# Patient Record
Sex: Male | Born: 1960 | Race: Black or African American | Hispanic: No | Marital: Married | Smoking: Light tobacco smoker
Health system: Southern US, Community
[De-identification: ages and names within clinical notes are randomized; demographics above are authoritative.]

## PROBLEM LIST (undated history)

## (undated) DIAGNOSIS — G4730 Sleep apnea, unspecified: Secondary | ICD-10-CM

## (undated) DIAGNOSIS — I10 Essential (primary) hypertension: Secondary | ICD-10-CM

## (undated) HISTORY — PX: HERNIA REPAIR: SHX51

## (undated) HISTORY — DX: Sleep apnea, unspecified: G47.30

## (undated) HISTORY — DX: Essential (primary) hypertension: I10

---

## 2012-08-29 DIAGNOSIS — R9431 Abnormal electrocardiogram [ECG] [EKG]: Secondary | ICD-10-CM | POA: Insufficient documentation

## 2012-09-17 DIAGNOSIS — K4090 Unilateral inguinal hernia, without obstruction or gangrene, not specified as recurrent: Secondary | ICD-10-CM | POA: Insufficient documentation

## 2014-07-13 DIAGNOSIS — M25562 Pain in left knee: Secondary | ICD-10-CM | POA: Insufficient documentation

## 2014-07-13 DIAGNOSIS — L439 Lichen planus, unspecified: Secondary | ICD-10-CM | POA: Insufficient documentation

## 2015-01-14 DIAGNOSIS — F5101 Primary insomnia: Secondary | ICD-10-CM | POA: Insufficient documentation

## 2015-07-21 ENCOUNTER — Ambulatory Visit: Payer: Self-pay | Admitting: Sports Medicine

## 2015-08-10 ENCOUNTER — Ambulatory Visit: Payer: Self-pay | Admitting: Sports Medicine

## 2015-08-19 ENCOUNTER — Ambulatory Visit (INDEPENDENT_AMBULATORY_CARE_PROVIDER_SITE_OTHER): Payer: BLUE CROSS/BLUE SHIELD

## 2015-08-19 ENCOUNTER — Encounter: Payer: Self-pay | Admitting: Sports Medicine

## 2015-08-19 ENCOUNTER — Telehealth: Payer: Self-pay | Admitting: Sports Medicine

## 2015-08-19 ENCOUNTER — Ambulatory Visit (INDEPENDENT_AMBULATORY_CARE_PROVIDER_SITE_OTHER): Payer: BLUE CROSS/BLUE SHIELD | Admitting: Sports Medicine

## 2015-08-19 VITALS — BP 115/80 | HR 88 | Resp 18 | Ht 70.75 in | Wt 250.1 lb

## 2015-08-19 DIAGNOSIS — I10 Essential (primary) hypertension: Secondary | ICD-10-CM

## 2015-08-19 DIAGNOSIS — E669 Obesity, unspecified: Secondary | ICD-10-CM

## 2015-08-19 DIAGNOSIS — M4806 Spinal stenosis, lumbar region: Secondary | ICD-10-CM

## 2015-08-19 DIAGNOSIS — M170 Bilateral primary osteoarthritis of knee: Secondary | ICD-10-CM

## 2015-08-19 DIAGNOSIS — M47816 Spondylosis without myelopathy or radiculopathy, lumbar region: Secondary | ICD-10-CM

## 2015-08-19 DIAGNOSIS — R7303 Prediabetes: Secondary | ICD-10-CM | POA: Diagnosis not present

## 2015-08-19 DIAGNOSIS — M5136 Other intervertebral disc degeneration, lumbar region: Secondary | ICD-10-CM | POA: Diagnosis not present

## 2015-08-19 MED ORDER — PHENTERMINE HCL 37.5 MG PO TABS: ORAL_TABLET | ORAL | Status: DC

## 2015-08-19 MED ORDER — MELOXICAM 15 MG PO TABS: ORAL_TABLET | ORAL | Status: DC

## 2015-08-19 NOTE — Assessment & Plan Note (Signed)
Adding meloxicam, we will also get him approved for Visco supplementation.

## 2015-08-19 NOTE — Telephone Encounter (Signed)
-----   Message from Monica Bectonhomas J Thekkekandam, MD sent at 08/19/2015  9:25 AM EDT ----- Serena CroissantHey Boogs,  orthovisc bilateral approval please, knee OA xray confirmed, failed NSAIDS and steroid injections. ___________________________________________ Ihor Austinhomas J. Benjamin Stainhekkekandam, M.D., ABFM., CAQSM. Primary Care and Sports Medicine [facility]  Adjunct Instructor of Family Medicine  [employer]

## 2015-08-19 NOTE — Telephone Encounter (Signed)
Submitted for approval on Orthovisc. Awaiting confirmation.  

## 2015-08-19 NOTE — Assessment & Plan Note (Signed)
Pain is predominantly right-sided, axial and likely facet mediated. X-rays, formal PT, weight loss

## 2015-08-19 NOTE — Progress Notes (Signed)
  Subjective:    CC: Establish care.   HPI:  Patient complains of progressively worsening middle low back pain. He states his back is very stiff in morning. Pain is worse with movement but does bother him at rest. Patient has had previous [facility] [place] and was told her had degenerative disc disease. Patient has never done physical therapy or taken medications for the back pain.   Patient is also having bilateral knee pain. Pain began having pain when he was in the [employer] 20 years ago. Patient states knees "throb" at rest, but the pain is much worse with standing and walking. Going down the stairs is especially difficult and painful. He has had to cut back on his physical activity due to the pain. [facility] for this problem and received bilateral knee injections. Patient states the injections only helped for about 2 months before the pain returned. Patient went to one physical [facility] [place] but did not believe it helped so did not return. He is not currently taking NSAIDs for pain.   Past medical history, Surgical history, Family history not pertinant except as noted below, Social history, Allergies, and medications have been entered into the medical record, reviewed, and no changes needed.   Review of Systems: No headache, visual changes, nausea, vomiting, diarrhea, constipation, dizziness, abdominal pain, skin rash, fevers, chills, night sweats, swollen lymph nodes, weight loss, chest pain, body aches, joint swelling, muscle aches, shortness of breath, mood changes, visual or auditory hallucinations.  Objective:    General: Well Developed, well nourished, and in no acute distress.  Neuro: Alert and oriented x3, extra-ocular muscles intact, sensation grossly intact.  HEENT: Normocephalic, atraumatic, pupils equal round reactive to light, neck supple, no masses, no  lymphadenopathy, thyroid nonpalpable.  Skin: Warm and dry, no rashes noted.  Cardiac: Regular rate and rhythm, no murmurs rubs or gallops.  Respiratory: Clear to auscultation bilaterally. Not using accessory muscles, speaking in full sentences.  Abdominal: Soft, nontender, nondistended, positive bowel sounds, no masses, no organomegaly.  Musculoskeletal: Shoulder, elbow, wrist, hip, knee, ankle stable, and with full range of motion.    Impression and Recommendations:   1. Primary Osteoarthritis of bilateral knees- Xrays, Meloxicam, Visco supplementation  2. Lumbar Lordosis- Formal PT, weight loss  3. Hypertension- well controlled, no changes  4. Obesity- Start Phentermine, Nutrition Referral   The patient was counselled, risk factors were discussed, anticipatory guidance given.

## 2015-08-19 NOTE — Assessment & Plan Note (Signed)
Starting phentermine, return monthly for weight checks and refills, nutrition referral.

## 2015-08-19 NOTE — Assessment & Plan Note (Signed)
Well controlled, no changes 

## 2015-08-20 NOTE — Telephone Encounter (Signed)
Received the following information from [employer] Benefits investigation:  This is a Editor, commissioning PPO Plan. The effective date is 05/02/2007. K4461 is covered at 100% and 20611 is covered at 100% of the contracted rate when administered in an office setting, no deductible. No referral is required. Pre-certification is not required. The specialist office copay is $40 if billed. Once out of pocket has been met, the copay will be waived. The reference number is [id number].  Left VM for Pt to return clinic call to schedule appointments for injections.

## 2015-08-25 ENCOUNTER — Ambulatory Visit (INDEPENDENT_AMBULATORY_CARE_PROVIDER_SITE_OTHER): Payer: BLUE CROSS/BLUE SHIELD | Admitting: Physical Therapy

## 2015-08-25 ENCOUNTER — Encounter: Payer: Self-pay | Admitting: Physical Therapy

## 2015-08-25 DIAGNOSIS — M6281 Muscle weakness (generalized): Secondary | ICD-10-CM | POA: Diagnosis not present

## 2015-08-25 DIAGNOSIS — M545 Low back pain, unspecified: Secondary | ICD-10-CM

## 2015-08-25 DIAGNOSIS — M25561 Pain in right knee: Secondary | ICD-10-CM | POA: Diagnosis not present
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
## 2015-08-25 NOTE — Patient Instructions (Addendum)
Pelvic Press     Place hands under belly between navel and pubic bone, palms up. Feel pressure on hands. Increase pressure on hands by pressing pelvis down. This is NOT a pelvic tilt. Hold __5_ seconds. Relax. Repeat _10__ times. Once a day.  KNEE: Flexion - Prone - hands under hips    Hold pelvic press. Bend knee, then return the foot down. Repeat on opposite leg. Do not raise hips. _10__ reps per set. When this is mastered, pull both heels up at same time, x 10 reps.  Once a day.  Don't let your hips rotate   Leg Lift: One-Leg   Press pelvis down. Keep knee straight; lengthen and lift one leg (from waist). Do not twist body. Keep other leg down. Hold _1__ seconds. Relax. Repeat 10 time. Repeat with other leg.  HIP: Extension / KNEE: Flexion - Prone    Hold pelvic press. Bend knee, squeeze glutes. Raise leg up  10___ reps per set, _1__ sets per day, _1__ time a day.   Axial Extension- Upper body sequence * always start with pelvic press    Lie on stomach with forehead resting on floor and arms at sides. Tuck chin in and raise head from floor without bending it up or down. Repeat ___10_ times per set. Do __1__ sets per session. Do _1___ sessions per day.  Progression:  Arms at side Arms in T shape Arms in W shape  Arms in M shape Arms in Y shape  [facility] [address] [place], [place] [address]  [phone] (office) [phone] (fax)

## 2015-08-30 ENCOUNTER — Ambulatory Visit (INDEPENDENT_AMBULATORY_CARE_PROVIDER_SITE_OTHER): Payer: BLUE CROSS/BLUE SHIELD | Admitting: Physical Therapy

## 2015-08-30 DIAGNOSIS — M545 Low back pain, unspecified: Secondary | ICD-10-CM

## 2015-08-30 DIAGNOSIS — M25561 Pain in right knee: Secondary | ICD-10-CM

## 2015-08-30 DIAGNOSIS — M6281 Muscle weakness (generalized): Secondary | ICD-10-CM

## 2015-08-30 NOTE — Therapy (Signed)
[facility] [place] [address] [place], [place], [address] Phone: [phone]   Fax:  [phone]  Physical Therapy Treatment  Patient Details  Name: Jeffrey Burnett MRN: [id number] Date of Birth: 06-07-1960 Referring Provider: Dr. Briant Sites  Encounter Date: 08/30/2015      PT End of Session - 08/30/15 0736    Visit Number 2   Number of Visits 12   PT Start Time 0733   PT Stop Time 0817   PT Time Calculation (min) 44 min      Past Medical History  Diagnosis Date  . Hypertension   . Sleep apnea     Past Surgical History  Procedure Laterality Date  . Hernia repair      hiatal hernia    There were no vitals filed for this visit.      Subjective Assessment - 08/30/15 0736    Subjective Pt reports no new changes since last visit.  He reports he would like to review HEP to know if he is doing them correctly.   Patient Stated Goals reduce the pain, have easier transitions to stand,    Currently in Pain? Yes   Pain Score 6    Pain Location Back   Pain Orientation Left;Right;Lower   Pain Descriptors / Indicators Throbbing;Sore   Pain Type Chronic pain   Aggravating Factors  transitional movments, prolonged postures.   Pain Relieving Factors estim.             [facility] PT Assessment - 08/30/15 0001    Assessment   Medical Diagnosis lumbar spondylosis   Referring Provider Dr. Briant Sites   Onset Date/Surgical Date 08/24/13   Next MD Visit 09/20/15   Prior Therapy a couple yrs ago           [facility] Adult PT Treatment/Exercise - 08/30/15 0001    Lumbar Exercises: Stretches   Passive Hamstring Stretch 2 reps;30 seconds  each leg, with strap   Passive Hamstring Stretch Limitations 2 reps seated with straight back   (very tight/ limited)   Lumbar Exercises: Aerobic   Stationary Bike NuStep L4: 5 min    Lumbar Exercises: Seated   Sit to Stand 5 reps  with core engaged   Lumbar Exercises: Supine   Ab Set 10  reps;5 seconds   Other Supine Lumbar Exercises Pelvic press x 10 reps x 5 sec (difficulty isolating without glute engagement); repeated with 5 reps unilateral knee flexion x 5 reps each side.    Other Supine Lumbar Exercises supine to/from sit via log roll x 5 reps    Moist Heat Therapy   Number Minutes Moist Heat 15 Minutes   Moist Heat Location Lumbar Spine   Electrical Stimulation   Electrical Stimulation Location lumbar paraspinals (bilaterally)   Electrical Stimulation Action IFC   Electrical Stimulation Parameters  to tolerance    Electrical Stimulation Goals Tone;Pain           PT Education - 08/30/15 1005    Education provided Yes   Education Details HEP, information on TENS unit.    Person(s) Educated Patient   Methods Explanation;Handout   Comprehension Verbalized understanding             PT Long Term Goals - 08/30/15 1005    PT LONG TERM GOAL #1   Title Independent with advanced HEP ( 10/06/15)    Time 6   Period Weeks   Status On-going   PT LONG TERM GOAL #2   Title  demo bilat hip extension and abduction strength =/> 5-/5 ( 10/06/15)    Time 6   Period Weeks   Status On-going   PT LONG TERM GOAL #3   Title report over reduction of pain in his back and bilat knees =/> 50% with daily activity ( 10/06/15)    Time 6   Period Weeks   Status On-going   PT LONG TERM GOAL #4   Title transition sit to stand with 50% less pain ( 10/06/15)    Time 6   Period Weeks   Status On-going   PT LONG TERM GOAL #5   Title consistently perform squats with good form ( 10/06/15)    Time 6   Period Weeks   Status On-going   PT LONG TERM GOAL #6   Title improve FOTO =/< 39% limited ( 10/06/15)    Time 6   Period Weeks   Status On-going               Plan - 08/30/15 1002    Clinical Impression Statement Pt tolerated today's therapeutic exercises with minimal increase in pain.  Pt demonstrated difficulty engaging multifidi without strongly contracting glutes and  hamstrings with pelvic press; slightly improved with tactile/ VC.   Progressing towards goals.    Rehab Potential Good   PT Frequency 2x / week   PT Duration 6 weeks   PT Treatment/Interventions Ultrasound;Traction;Patient/family education;Passive range of motion;Dry needling;Cryotherapy;Electrical Stimulation;Moist Heat;Therapeutic exercise;Manual techniques;Vasopneumatic Device;Taping   PT Next Visit Plan progress deep core ther ex, LE quad eccentric work, TDN and manual therapy to back as indicated.    Consulted and Agree with Plan of Care Patient      Patient will benefit from skilled therapeutic intervention in order to improve the following deficits and impairments:  Decreased strength, Hypomobility, Impaired flexibility, Pain, Increased muscle spasms, Decreased range of motion  Visit Diagnosis: Bilateral low back pain without sciatica  Muscle weakness (generalized)  Pain in right knee     Problem List Patient Active Problem List   Diagnosis Date Noted  . Primary osteoarthritis of both knees 08/19/2015  . 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 Essential hypertension, benign 08/19/2015  . Prediabetes 08/19/2015  . Obesity 08/19/2015   Mayer CamelJennifer Carlson-Long, PTA 08/30/2015 10:10 AM   [facility] [address] [place], [place], [address] Phone: [phone]   Fax:  [phone]  Name: Jeffrey Burnett MRN: [id number] Date of Birth: February 24, 1961

## 2015-08-30 NOTE — Patient Instructions (Signed)
Hamstring Step 1    Straighten left knee. Keep knee level with other knee or on bolster. Hold _30__ seconds. Relax knee by returning foot to start. Repeat __2-3_ times.   Abdominal Bracing With Pelvic Floor (Hook-Lying)   With neutral spine, tighten pelvic floor and abdominals. Hold 5-10 seconds. Repeat __10_ times. Do _1__ times a day.    [facility] [address] [place], [place] [address]  [phone] (office) [phone] (fax)

## 2015-09-01 ENCOUNTER — Encounter: Payer: BLUE CROSS/BLUE SHIELD | Admitting: Physical Therapy

## 2015-09-03 ENCOUNTER — Ambulatory Visit (INDEPENDENT_AMBULATORY_CARE_PROVIDER_SITE_OTHER): Payer: BLUE CROSS/BLUE SHIELD | Admitting: Rehabilitative and Restorative Service Providers"

## 2015-09-03 ENCOUNTER — Encounter: Payer: Self-pay | Admitting: Rehabilitative and Restorative Service Providers"

## 2015-09-03 DIAGNOSIS — M545 Low back pain, unspecified: Secondary | ICD-10-CM

## 2015-09-03 DIAGNOSIS — M6281 Muscle weakness (generalized): Secondary | ICD-10-CM

## 2015-09-03 DIAGNOSIS — M25561 Pain in right knee: Secondary | ICD-10-CM | POA: Diagnosis not present

## 2015-09-03 NOTE — Patient Instructions (Signed)
Abdominal Bracing With Pelvic Floor (Hook-Lying)    With neutral spine, tighten pelvic floor and abdominals sucking belly button to back bone; tighten muscles in low back at waist. Hold 10 sec  Repeat __10_ times. Do __several_ times a day. Progress to do this in sitting; standing; walking and with functional activities   Trigger Point Dry Needling  . What is Trigger Point Dry Needling (DN)? o DN is a physical therapy technique used to treat muscle pain and dysfunction. Specifically, DN helps deactivate muscle trigger points (muscle knots).  o A thin filiform needle is used to penetrate the skin and stimulate the underlying trigger point. The goal is for a local twitch response (LTR) to occur and for the trigger point to relax. No medication of any kind is injected during the procedure.   . What Does Trigger Point Dry Needling Feel Like?  o The procedure feels different for each individual patient. Some patients report that they do not actually feel the needle enter the skin and overall the process is not painful. Very mild bleeding may occur. However, many patients feel a deep cramping in the muscle in which the needle was inserted. This is the local twitch response.   Marland Kitchen. How Will I feel after the treatment? o Soreness is normal, and the onset of soreness may not occur for a few hours. Typically this soreness does not last longer than two days.  o Bruising is uncommon, however; ice can be used to decrease any possible bruising.  o In rare cases feeling tired or nauseous after the treatment is normal. In addition, your symptoms may get worse before they get better, this period will typically not last longer than 24 hours.   . What Can I do After My Treatment? o Increase your hydration by drinking more water for the next 24 hours. o You may place ice or heat on the areas treated that have become sore, however, do not use heat on inflamed or bruised areas. Heat often brings more relief post  needling. o You can continue your regular activities, but vigorous activity is not recommended initially after the treatment for 24 hours. o DN is best combined with other physical therapy such as strengthening, stretching, and other therapies.  o

## 2015-09-03 NOTE — Therapy (Signed)
[facility] [address] [place], [place], [address] Phone: [phone]   Fax:  [phone]  Physical Therapy Treatment  Patient Details  Name: Jeffrey PrimeOliver Mikkelson MRN: [id number] Date of Birth: 1960-08-25 Referring Provider: Dr. Briant Siteshekkekandem  Encounter Date: 09/03/2015      PT End of Session - 09/03/15 0807    Visit Number 3   Number of Visits 12   Date for PT Re-Evaluation 10/06/15   PT Start Time 0805   PT Stop Time 0900   PT Time Calculation (min) 55 min   Activity Tolerance Patient tolerated treatment well      Past Medical History  Diagnosis Date  . Hypertension   . Sleep apnea     Past Surgical History  Procedure Laterality Date  . Hernia repair      hiatal hernia    There were no vitals filed for this visit.      Subjective Assessment - 09/03/15 0807    Subjective Patient reports that he has not noticed much difference - but does feel that the exercises are beginning to work some. He stopped taking the antiinflammatory medicine from the doctor Monday and can tell a difference without that.    Currently in Pain? Yes   Pain Score 7    Pain Location Back   Pain Orientation Left;Right;Lower   Pain Descriptors / Indicators Throbbing;Sore                         [facility] Adult PT Treatment/Exercise - 09/03/15 0001    Exercises   Exercises Lumbar   Lumbar Exercises: Stretches   Passive Hamstring Stretch 2 reps;30 seconds  each leg, w/ strap; modified to opposite knee bent d/t LBP    Lumbar Exercises: Aerobic   Stationary Bike NuStep L5: 5 min    Lumbar Exercises: Supine   Ab Set 10 reps;5 seconds   Other Supine Lumbar Exercises Pelvic press x 10 reps x 5 sec (difficulty isolating without glute engagement); repeated with 5 reps unilateral knee flexion x 5 reps each side.    Other Supine Lumbar Exercises supine to/from sit via log roll x 2 - reviewed technique    Moist Heat Therapy   Number  Minutes Moist Heat 15 Minutes   Moist Heat Location Lumbar Spine   Electrical Stimulation   Electrical Stimulation Location lumbar paraspinals (bilaterally)   Electrical Stimulation Action IFC   Electrical Stimulation Parameters to tolerance    Electrical Stimulation Goals Tone;Pain   Manual Therapy   Soft tissue mobilization bilat multifi and lumbar paraspinals           Trigger Point Dry Needling - 09/03/15 0848    Longissimus Response Twitch response elicited;Palpable increased muscle length              PT Education - 09/03/15 0846    Education provided Yes   Education Details HEP; TDN   Person(s) Educated Patient   Methods Explanation;Demonstration;Tactile cues;Verbal cues;Handout   Comprehension Verbalized understanding;Returned demonstration;Verbal cues required;Tactile cues required             PT Long Term Goals - 08/30/15 1005    PT LONG TERM GOAL #1   Title Independent with advanced HEP ( 10/06/15)    Time 6   Period Weeks   Status On-going   PT LONG TERM GOAL #2   Title demo bilat hip extension and abduction strength =/> 5-/5 ( 10/06/15)    Time  6   Period Weeks   Status On-going   PT LONG TERM GOAL #3   Title report over reduction of pain in his back and bilat knees =/> 50% with daily activity ( 10/06/15)    Time 6   Period Weeks   Status On-going   PT LONG TERM GOAL #4   Title transition sit to stand with 50% less pain ( 10/06/15)    Time 6   Period Weeks   Status On-going   PT LONG TERM GOAL #5   Title consistently perform squats with good form ( 10/06/15)    Time 6   Period Weeks   Status On-going   PT LONG TERM GOAL #6   Title improve FOTO =/< 39% limited ( 10/06/15)    Time 6   Period Weeks   Status On-going               Plan - 09/03/15 0849    Clinical Impression Statement Patient tolerated TDN with some difficulty but tolerated needling to multifi and lumbar paraspoinal which are tight to palpatiion - some improvement in muscle  tension post TDN. Added 3 part dore for core stabilization. Progressing with rehab gradually.    Rehab Potential Good   PT Frequency 2x / week   PT Duration 6 weeks   PT Treatment/Interventions Ultrasound;Traction;Patient/family education;Passive range of motion;Dry needling;Cryotherapy;Electrical Stimulation;Moist Heat;Therapeutic exercise;Manual techniques;Vasopneumatic Device;Taping   PT Next Visit Plan progress deep core ther ex, LE quad eccentric work, TDN and manual therapy to back as indicated.    Consulted and Agree with Plan of Care Patient      Patient will benefit from skilled therapeutic intervention in order to improve the following deficits and impairments:  Decreased strength, Hypomobility, Impaired flexibility, Pain, Increased muscle spasms, Decreased range of motion  Visit Diagnosis: Bilateral low back pain without sciatica  Muscle weakness (generalized)  Pain in right knee     Problem List Patient Active Problem List   Diagnosis Date Noted  . Primary osteoarthritis of both knees 08/19/2015  . 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 Essential hypertension, benign 08/19/2015  . Prediabetes 08/19/2015  . Obesity 08/19/2015    Celyn P HoltPT, MPH  09/03/2015, 8:51 AM  [facility] [address] [place], [place], [address] Phone: [phone]   Fax:  [phone]  Name: Dhyan Noah MRN: [id number] Date of Birth: [birth year]

## 2015-09-06 ENCOUNTER — Ambulatory Visit (INDEPENDENT_AMBULATORY_CARE_PROVIDER_SITE_OTHER): Payer: BLUE CROSS/BLUE SHIELD | Admitting: Physical Therapy

## 2015-09-06 ENCOUNTER — Ambulatory Visit (INDEPENDENT_AMBULATORY_CARE_PROVIDER_SITE_OTHER): Payer: BLUE CROSS/BLUE SHIELD | Admitting: Sports Medicine

## 2015-09-06 ENCOUNTER — Encounter: Payer: Self-pay | Admitting: Sports Medicine

## 2015-09-06 VITALS — BP 115/80 | HR 84 | Resp 18 | Wt 244.0 lb

## 2015-09-06 DIAGNOSIS — M170 Bilateral primary osteoarthritis of knee: Secondary | ICD-10-CM

## 2015-09-06 DIAGNOSIS — H9113 Presbycusis, bilateral: Secondary | ICD-10-CM | POA: Diagnosis not present

## 2015-09-06 DIAGNOSIS — M545 Low back pain, unspecified: Secondary | ICD-10-CM

## 2015-09-06 DIAGNOSIS — M6281 Muscle weakness (generalized): Secondary | ICD-10-CM | POA: Diagnosis not present

## 2015-09-06 DIAGNOSIS — H9110 Presbycusis, unspecified ear: Secondary | ICD-10-CM | POA: Insufficient documentation

## 2015-09-06 NOTE — Progress Notes (Signed)
  Subjective:    CC: injections and difficulty hearing  HPI: This is a pleasant 55 year old male, he is here to start his Orthovisc injections.  He also has complaints of difficulty hearing. Desires referral to audiology.  Past medical history, Surgical history, Family history not pertinant except as noted below, Social history, Allergies, and medications have been entered into the medical record, reviewed, and no changes needed.   Review of Systems: No fevers, chills, night sweats, weight loss, chest pain, or shortness of breath.   Objective:    General: Well Developed, well nourished, and in no acute distress.  Neuro: Alert and oriented x3, extra-ocular muscles intact, sensation grossly intact.  HEENT: Normocephalic, atraumatic, pupils equal round reactive to light, neck supple, no masses, no lymphadenopathy, thyroid nonpalpable.  Skin: Warm and dry, no rashes. Cardiac: Regular rate and rhythm, no murmurs rubs or gallops, no lower extremity edema.  Respiratory: Clear to auscultation bilaterally. Not using accessory muscles, speaking in full sentences.  Procedure: Real-time Ultrasound Guided Injection of right knee Device: GE Logiq E  Verbal informed consent obtained.  Time-out conducted.  Noted no overlying erythema, induration, or other signs of local infection.  Skin prepped in a sterile fashion.  Local anesthesia: Topical Ethyl chloride.  With sterile technique and under real time ultrasound guidance:   30 mg/2 mL of OrthoVisc (sodium hyaluronate) in a prefilled syringe was injected easily into the knee through a 22-gauge needle. Completed without difficulty  Pain immediately resolved suggesting accurate placement of the medication.  Advised to call if fevers/chills, erythema, induration, drainage, or persistent bleeding.  Images permanently stored and available for review in the ultrasound unit.  Impression: Technically successful ultrasound guided injection.  Procedure:  Real-time Ultrasound Guided Injection of ;eft knee Device: GE Logiq E  Verbal informed consent obtained.  Time-out conducted.  Noted no overlying erythema, induration, or other signs of local infection.  Skin prepped in a sterile fashion.  Local anesthesia: Topical Ethyl chloride.  With sterile technique and under real time ultrasound guidance:   30 mg/2 mL of OrthoVisc (sodium hyaluronate) in a prefilled syringe was injected easily into the knee through a 22-gauge needle. Completed without difficulty  Pain immediately resolved suggesting accurate placement of the medication.  Advised to call if fevers/chills, erythema, induration, drainage, or persistent bleeding.  Images permanently stored and available for review in the ultrasound unit.  Impression: Technically successful ultrasound guided injection.  Impression and Recommendations:

## 2015-09-06 NOTE — Therapy (Signed)
[facility] [address] [place], [place], [address] Phone: [phone]   Fax:  [phone]  Physical Therapy Treatment  Patient Details  Name: Jeffrey Burnett MRN: [id number] Date of Birth: Aug 03, 1960 Referring Provider: Dr. Briant Siteshekkekandem  Encounter Date: 09/06/2015      PT End of Session - 09/06/15 0711    Visit Number 4   Number of Visits 12   Date for PT Re-Evaluation 10/06/15   PT Start Time 0703   PT Stop Time 0806   PT Time Calculation (min) 63 min   Activity Tolerance Patient tolerated treatment well      Past Medical History  Diagnosis Date  . Hypertension   . Sleep apnea     Past Surgical History  Procedure Laterality Date  . Hernia repair      hiatal hernia    There were no vitals filed for this visit.      Subjective Assessment - 09/06/15 0712    Subjective Pt reports he had some soreness after TDN last week.  Has restarted anti-inflammatory medicine from doctor.  Pt reports he continues to have difficulty with prone presses.    How long can you sit comfortably? 30 minutes    Currently in Pain? Yes   Pain Score 6    Pain Location Back   Pain Orientation Right;Left;Lower   Pain Descriptors / Indicators Throbbing;Sore   Aggravating Factors  prolonged postures    Pain Relieving Factors estim            [facility] PT Assessment - 09/06/15 0001    Assessment   Medical Diagnosis lumbar spondylosis   Referring Provider Dr. Briant Siteshekkekandem   Onset Date/Surgical Date 08/24/13   Next MD Visit 09/20/15   Prior Therapy a couple yrs ago          [facility] Adult [facility] Treatment/Exercise - 09/06/15 0001    Lumbar Exercises: Stretches   Passive Hamstring Stretch 2 reps;30 seconds  supine with strap    Single Knee to Chest Stretch 2 reps;30 seconds   ITB Stretch 2 reps;30 seconds   Lumbar Exercises: Aerobic   Stationary Bike NuStep L5: 5 min    Lumbar Exercises: Seated   Hip Flexion on Ball Limitations  Seated on dynadisc:  pelvic tilts ant/post, side to side, CW/ CWW x 10 each.  Then marching with TransAb engaged x 10 and LAQ with core engaged x 10.    Lumbar Exercises: Supine   Ab Set 10 reps;5 seconds   Clam 10 reps;1 second  with TranAb engaged.    Clam Limitations VC to slow down   Bent Knee Raise 10 reps  with TransAb engaged   Bent Knee Raise Limitations VC to slow down   Moist Heat Therapy   Number Minutes Moist Heat 15 Minutes   Moist Heat Location Lumbar Spine   Electrical Stimulation   Electrical Stimulation Location Lumbar paraspinals (bilaterally)   Electrical Stimulation Action IFC   Electrical Stimulation Parameters to tolerance    Electrical Stimulation Goals Tone;Pain   Manual Therapy   Manual Therapy Myofascial release;Taping   Myofascial Release pt prone:  MFR to bilateral lumbar paraspinals    Kinesiotex --  Rock tape in X across lumbar spine to dec pain, tightness.      Prone:  Pelvic press x 5 sec x 5 reps.  Pt unable to isolate multifidi and reports increased back discomfort when attempting this.  Stopped this activity and changed position.  PT Long Term Goals - 08/30/15 1005    PT LONG TERM GOAL #1   Title Independent with advanced HEP ( 10/06/15)    Time 6   Period Weeks   Status On-going   PT LONG TERM GOAL #2   Title demo bilat hip extension and abduction strength =/> 5-/5 ( 10/06/15)    Time 6   Period Weeks   Status On-going   PT LONG TERM GOAL #3   Title report over reduction of pain in his back and bilat knees =/> 50% with daily activity ( 10/06/15)    Time 6   Period Weeks   Status On-going   PT LONG TERM GOAL #4   Title transition sit to stand with 50% less pain ( 10/06/15)    Time 6   Period Weeks   Status On-going   PT LONG TERM GOAL #5   Title consistently perform squats with good form ( 10/06/15)    Time 6   Period Weeks   Status On-going   PT LONG TERM GOAL #6   Title improve FOTO =/< 39% limited ( 10/06/15)    Time 6    Period Weeks   Status On-going               Plan - 09/06/15 0754    Clinical Impression Statement Pt tolerated exercises well without increase in low back pain.  Noted decreased tissue tightness in LB after manual therapy, however once pt sat up he reported increased "throbbing".  pt reported 2 point reduction in pain in low back after use of estim and heat.  Progressing gradually towards goals.    Rehab Potential Good   PT Frequency 2x / week   PT Duration 6 weeks   PT Treatment/Interventions Ultrasound;Traction;Patient/family education;Passive range of motion;Dry needling;Cryotherapy;Electrical Stimulation;Moist Heat;Therapeutic exercise;Manual techniques;Vasopneumatic Device;Taping   PT Next Visit Plan progress deep core ther ex, LE quad eccentric work, modalities and  manual therapy to back as indicated. Test hip strength. ]   Consulted and Agree with Plan of Care Patient      Patient will benefit from skilled therapeutic intervention in order to improve the following deficits and impairments:  Decreased strength, Hypomobility, Impaired flexibility, Pain, Increased muscle spasms, Decreased range of motion  Visit Diagnosis: Bilateral low back pain without sciatica  Muscle weakness (generalized)     Problem List Patient Active Problem List   Diagnosis Date Noted  . Presbycusis 09/06/2015  . Primary osteoarthritis of both knees 08/19/2015  . 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 Essential hypertension, benign 08/19/2015  . Prediabetes 08/19/2015  . Obesity 08/19/2015   Mayer Camel, PTA 09/06/2015 1:22 PM  [facility] [place] [address] [place], [place], [address] Phone: [phone]   Fax:  [phone]  Name: Jeffrey Burnett MRN: [id number] Date of Birth: 1961-01-30

## 2015-09-06 NOTE — Assessment & Plan Note (Signed)
Orthovisc injection #1 of 4 into both knees, return in one week for #2 of 4. 

## 2015-09-06 NOTE — Assessment & Plan Note (Signed)
Referral to audiology.

## 2015-09-08 ENCOUNTER — Ambulatory Visit (INDEPENDENT_AMBULATORY_CARE_PROVIDER_SITE_OTHER): Payer: BLUE CROSS/BLUE SHIELD | Admitting: Physical Therapy

## 2015-09-08 DIAGNOSIS — M25561 Pain in right knee: Secondary | ICD-10-CM | POA: Diagnosis not present

## 2015-09-08 DIAGNOSIS — M545 Low back pain, unspecified: Secondary | ICD-10-CM

## 2015-09-08 DIAGNOSIS — M6281 Muscle weakness (generalized): Secondary | ICD-10-CM

## 2015-09-08 NOTE — Therapy (Signed)
[facility] [address] [place], [place], [address] Phone: [phone]   Fax:  [phone]  Physical Therapy Treatment  Patient Details  Name: Jeffrey Burnett MRN: [id number] Date of Birth: 1960/10/06 Referring Provider: Dr. Briant Siteshekkekandem  Encounter Date: 09/08/2015      PT End of Session - 09/08/15 0708    Visit Number 5   Number of Visits 12   Date for PT Re-Evaluation 10/06/15   PT Start Time 0702   PT Stop Time 0803   PT Time Calculation (min) 61 min   Activity Tolerance Patient tolerated treatment well      Past Medical History  Diagnosis Date  . Hypertension   . Sleep apnea     Past Surgical History  Procedure Laterality Date  . Hernia repair      hiatal hernia    There were no vitals filed for this visit.      Subjective Assessment - 09/08/15 0726    Subjective Pt reports he received injections Orthovisc in bilateral knees 2 days ago. Pt reports he feels tape has helped his back, more awareness in posture and slight reduction in pain.    Currently in Pain? Yes   Pain Score 5    Pain Location Back   Pain Orientation Right;Left   Pain Descriptors / Indicators Sore   Multiple Pain Sites Yes   Pain Score 6   Pain Location Knee   Pain Orientation Right;Left   Pain Descriptors / Indicators Throbbing   Pain Frequency Intermittent   Aggravating Factors  descending stairs    Pain Relieving Factors knee brace, icy hot            [facility] PT Assessment - 09/08/15 0001    Assessment   Medical Diagnosis lumbar spondylosis   Referring Provider Dr. Briant Siteshekkekandem   Onset Date/Surgical Date 08/24/13   Next MD Visit 09/20/15   Prior Therapy a couple yrs ago   Strength   Right Hip Extension 4/5   Right Hip ABduction 4+/5   Left Hip Extension --  5-/5   Left Hip ABduction --  5-/5          [facility] Adult PT Treatment/Exercise - 09/08/15 0001    Lumbar Exercises: Stretches   Passive Hamstring Stretch  30 seconds;4 reps  2- seated, 2 supine   Single Knee to Chest Stretch 2 reps;30 seconds   Quad Stretch 4 reps;30 seconds  (2-standing, 2- prone with strap)   Lumbar Exercises: Aerobic   Stationary Bike NuStep L5: 5 min    Lumbar Exercises: Supine   Bridge 10 reps;5 seconds  repeated with ball squeeze    Lumbar Exercises: Sidelying   Clam 10 reps  each side   Hip Abduction 10 reps   Hip Abduction Limitations RLE very challenging, LBP increased, stopped.    Lumbar Exercises: Prone   Straight Leg Raise 5 reps  2 sets each side   Straight Leg Raises Limitations RLE challenging. VC for core engaged.    Opposite Arm/Leg Raise Right arm/Left leg;Left arm/Right leg;5 reps;1 second  VC for form   Moist Heat Therapy   Number Minutes Moist Heat 15 Minutes   Moist Heat Location Lumbar Spine;Knee   Electrical Stimulation   Electrical Stimulation Location Lumbar paraspinals (bilaterally)   Electrical Stimulation Action IFC   Electrical Stimulation Parameters to tolerance    Electrical Stimulation Goals Pain   Manual Therapy   Manual Therapy Taping   Kinesiotex --  Dynamitape in  X across lumbar spine to dec pain, tightness.                      PT Long Term Goals - 08/30/15 1005    PT LONG TERM GOAL #1   Title Independent with advanced HEP ( 10/06/15)    Time 6   Period Weeks   Status On-going   PT LONG TERM GOAL #2   Title demo bilat hip extension and abduction strength =/> 5-/5 ( 10/06/15)    Time 6   Period Weeks   Status On-going   PT LONG TERM GOAL #3   Title report over reduction of pain in his back and bilat knees =/> 50% with daily activity ( 10/06/15)    Time 6   Period Weeks   Status On-going   PT LONG TERM GOAL #4   Title transition sit to stand with 50% less pain ( 10/06/15)    Time 6   Period Weeks   Status On-going   PT LONG TERM GOAL #5   Title consistently perform squats with good form ( 10/06/15)    Time 6   Period Weeks   Status On-going   PT LONG  TERM GOAL #6   Title improve FOTO =/< 39% limited ( 10/06/15)    Time 6   Period Weeks   Status On-going               Plan - 09/08/15 0754    Clinical Impression Statement Pt had positive response to tape applied to low back.  Pt demonstrated improved strength in hip strength.  Rt hip ext and abduction strength exercises challenging.  Progressing towards goals.    Rehab Potential Good   PT Frequency 2x / week   PT Duration 6 weeks   PT Treatment/Interventions Ultrasound;Traction;Patient/family education;Passive range of motion;Dry needling;Cryotherapy;Electrical Stimulation;Moist Heat;Therapeutic exercise;Manual techniques;Vasopneumatic Device;Taping   PT Next Visit Plan progress deep core ther ex, LE quad eccentric work, modalities and  manual therapy to back as indicated.   Assess response to dynamic tape vs Rock tape.    Consulted and Agree with Plan of Care Patient      Patient will benefit from skilled therapeutic intervention in order to improve the following deficits and impairments:  Decreased strength, Hypomobility, Impaired flexibility, Pain, Increased muscle spasms, Decreased range of motion  Visit Diagnosis: Bilateral low back pain without sciatica  Muscle weakness (generalized)  Pain in right knee     Problem List Patient Active Problem List   Diagnosis Date Noted  . Presbycusis 09/06/2015  . Primary osteoarthritis of both knees 08/19/2015  . 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 Essential hypertension, benign 08/19/2015  . Prediabetes 08/19/2015  . Obesity 08/19/2015   Mayer Camel, PTA 09/08/2015 7:59 AM  [facility] [address] [place], [place], [address] Phone: [phone]   Fax:  [phone]  Name: Jeffrey Burnett MRN: [id number] Date of Birth: Oct 10, 1960

## 2015-09-10 DIAGNOSIS — R7303 Prediabetes: Secondary | ICD-10-CM | POA: Diagnosis not present

## 2015-09-10 DIAGNOSIS — E669 Obesity, unspecified: Secondary | ICD-10-CM | POA: Diagnosis not present

## 2015-09-13 ENCOUNTER — Ambulatory Visit (INDEPENDENT_AMBULATORY_CARE_PROVIDER_SITE_OTHER): Payer: BLUE CROSS/BLUE SHIELD | Admitting: Sports Medicine

## 2015-09-13 ENCOUNTER — Encounter: Payer: Self-pay | Admitting: Sports Medicine

## 2015-09-13 VITALS — BP 116/73 | HR 84 | Resp 16 | Wt 242.1 lb

## 2015-09-13 DIAGNOSIS — M170 Bilateral primary osteoarthritis of knee: Secondary | ICD-10-CM | POA: Diagnosis not present

## 2015-09-13 NOTE — Addendum Note (Signed)
Addended by: Baird KayUGLAS, Rino Hosea M on: 09/13/2015 08:47 AM   Modules accepted: Medications

## 2015-09-13 NOTE — Assessment & Plan Note (Signed)
Orthovisc injection #2 both knees, return in one week for #3 of 4

## 2015-09-13 NOTE — Progress Notes (Signed)

## 2015-09-15 ENCOUNTER — Ambulatory Visit (INDEPENDENT_AMBULATORY_CARE_PROVIDER_SITE_OTHER): Payer: BLUE CROSS/BLUE SHIELD | Admitting: Physical Therapy

## 2015-09-15 DIAGNOSIS — M25561 Pain in right knee: Secondary | ICD-10-CM | POA: Diagnosis not present

## 2015-09-15 DIAGNOSIS — M545 Low back pain, unspecified: Secondary | ICD-10-CM

## 2015-09-15 DIAGNOSIS — M6281 Muscle weakness (generalized): Secondary | ICD-10-CM

## 2015-09-15 NOTE — Therapy (Signed)
[place], [place], [address] Phone: [phone]   Fax:  [phone]  Physical Therapy Treatment  Patient Details  Name: Jeffrey Burnett MRN: [id number] Date of Birth: 1961-04-06 Referring Provider: Dr. Helane Rima  Encounter Date: 09/15/2015      PT End of Session - 09/15/15 1644    Visit Number 6   Number of Visits 12   Date for PT Re-Evaluation 10/06/15   PT Start Time 1602   PT Stop Time 1650   PT Time Calculation (min) 48 min   Activity Tolerance Patient tolerated treatment well      Past Medical History  Diagnosis Date  . Hypertension   . Sleep apnea     Past Surgical History  Procedure Laterality Date  . Hernia repair      hiatal hernia    There were no vitals filed for this visit.      Subjective Assessment - 09/15/15 1604    Subjective Pt reports he feels his back is improved a little.  Knee pain is unchanged.  Received 2nd Orthovisc injection in knees two days ago.  Pt reports the tape increase his awareness of his posture.    Currently in Pain? Yes   Pain Score 5    Pain Location Back   Pain Orientation Right;Left   Pain Descriptors / Indicators Dull   Aggravating Factors  prolonged posture    Pain Relieving Factors estim    Pain Score 7   Pain Location Knee   Pain Orientation Right;Left   Pain Descriptors / Indicators Throbbing;Sharp   Aggravating Factors  descending stair.    Pain Relieving Factors knee brace and icy hot             [facility] PT Assessment - 09/15/15 0001    Assessment   Medical Diagnosis lumbar spondylosis   Referring Provider Dr. Helane Rima   Onset Date/Surgical Date 08/24/13   Next MD Visit 09/20/15   Strength   Right Hip Extension 4+/5   Right Hip ABduction 5/5   Left Hip Extension --  5-/5   Left Hip ABduction 5/5   Flexibility   Soft Tissue Assessment /Muscle Length yes   Hamstrings Rt 60 deg, Lt 52 deg    Quadriceps Rt 120 deg  knee flexion, Lt 109 deg knee flexion           [facility] Adult PT Treatment/Exercise - 09/15/15 0001    Lumbar Exercises: Stretches   Passive Hamstring Stretch 30 seconds;4 reps  2- seated, 2 supine   Single Knee to Chest Stretch 2 reps;30 seconds   Prone on Elbows Stretch 1 rep;30 seconds   Quad Stretch 3 reps;30 seconds  prone with strap   Lumbar Exercises: Aerobic   Stationary Bike NuStep L5: 5 min    Lumbar Exercises: Standing   Other Standing Lumbar Exercises functional squat with core engaged x 10 reps.    Other Standing Lumbar Exercises sit to/from stand with core engaged x 10 reps   Lumbar Exercises: Prone   Straight Leg Raise 5 reps  2 sets each side   Opposite Arm/Leg Raise Right arm/Left leg;Left arm/Right leg;5 reps;1 second  VC for form   Opposite Arm/Leg Raise Limitations challenging   Moist Heat Therapy   Number Minutes Moist Heat 15 Minutes   Moist Heat Location Lumbar Spine;Knee   Electrical Stimulation   Electrical Stimulation Location Lumbar   Electrical Stimulation Action IFC   Electrical Stimulation Parameters to tolerance  Electrical Stimulation Goals Pain                     PT Long Term Goals - 08/30/15 1005    PT LONG TERM GOAL #1   Title Independent with advanced HEP ( 10/06/15)    Time 6   Period Weeks   Status On-going   PT LONG TERM GOAL #2   Title demo bilat hip extension and abduction strength =/> 5-/5 ( 10/06/15)    Time 6   Period Weeks   Status Partially met   PT LONG TERM GOAL #3   Title report over reduction of pain in his back and bilat knees =/> 50% with daily activity ( 10/06/15)    Time 6   Period Weeks   Status On-going   PT LONG TERM GOAL #4   Title transition sit to stand with 50% less pain ( 10/06/15)    Time 6   Period Weeks   Status On-going   PT LONG TERM GOAL #5   Title consistently perform squats with good form ( 10/06/15)    Time 6   Period Weeks   Status On-going   PT LONG TERM GOAL #6   Title improve  FOTO =/< 39% limited ( 10/06/15)    Time 6   Period Weeks   Status On-going               Plan - 09/15/15 1640    Clinical Impression Statement Pt demonstrated improved hip strength, partially met LTG #2. Pt continues with tight quads/hamstrings bilaterally (Lt tighter than Rt). Pt began to have increased back pain after squats and sit to stand, despite engaging core.  Pain reduced with use of estim and MHP.  Pt making gradual progress towards remaining goals.    Rehab Potential Good   PT Frequency 2x / week   PT Duration 6 weeks   PT Treatment/Interventions Ultrasound;Traction;Patient/family education;Passive range of motion;Dry needling;Cryotherapy;Electrical Stimulation;Moist Heat;Therapeutic exercise;Manual techniques;Vasopneumatic Device;Taping   PT Next Visit Plan Assess goals, FOTO, MD note for upcoming appt on 5/22.    Consulted and Agree with Plan of Care Patient      Patient will benefit from skilled therapeutic intervention in order to improve the following deficits and impairments:  Decreased strength, Hypomobility, Impaired flexibility, Pain, Increased muscle spasms, Decreased range of motion  Visit Diagnosis: Bilateral low back pain without sciatica  Muscle weakness (generalized)  Pain in right knee     Problem List Patient Active Problem List   Diagnosis Date Noted  . Presbycusis 09/06/2015  . Primary osteoarthritis of both knees 08/19/2015  . 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 Essential hypertension, benign 08/19/2015  . Prediabetes 08/19/2015  . Obesity 08/19/2015   Kerin Perna, PTA 09/15/2015 4:46 PM  [facility] [place], [place], [address] Phone: [phone]   Fax:  [phone]  Name: Jeffrey Burnett MRN: [id number] Date of Birth: 12-08-1960

## 2015-09-17 ENCOUNTER — Encounter: Payer: Self-pay | Admitting: Rehabilitative and Restorative Service Providers"

## 2015-09-17 ENCOUNTER — Ambulatory Visit (INDEPENDENT_AMBULATORY_CARE_PROVIDER_SITE_OTHER): Payer: BLUE CROSS/BLUE SHIELD | Admitting: Rehabilitative and Restorative Service Providers"

## 2015-09-17 DIAGNOSIS — M6281 Muscle weakness (generalized): Secondary | ICD-10-CM | POA: Diagnosis not present

## 2015-09-17 DIAGNOSIS — M545 Low back pain, unspecified: Secondary | ICD-10-CM

## 2015-09-17 DIAGNOSIS — M25561 Pain in right knee: Secondary | ICD-10-CM | POA: Diagnosis not present

## 2015-09-17 NOTE — Therapy (Signed)
[facility] [place] [address] [place], [place], [address] Phone: [phone]   Fax:  [phone]  Physical Therapy Treatment  Patient Details  Name: Jeffrey Burnett MRN: [id number] Date of Birth: November 23, 1960 Referring Provider: Dr. Benjamin Stain  Encounter Date: 09/17/2015      PT End of Session - 09/17/15 0802    Visit Number 7   Number of Visits 12   Date for PT Re-Evaluation 10/06/15   PT Start Time 0804   PT Stop Time 0855   PT Time Calculation (min) 51 min   Activity Tolerance Patient tolerated treatment well      Past Medical History  Diagnosis Date  . Hypertension   . Sleep apnea     Past Surgical History  Procedure Laterality Date  . Hernia repair      hiatal hernia    There were no vitals filed for this visit.      Subjective Assessment - 09/17/15 0811    Subjective Pt reports he feels his back is improved a little.  Knee pain is unchanged.  Received 2nd Orthovisc injection this week.  Third injectioin is scheduled for Monday. Pt reports the tape increase his awareness of his posture.    Pertinent History bilat knee pain - OA , broken sacrum 1991, jumping out of airplane in [employer]   How long can you sit comfortably? 1 hour    How long can you walk comfortably? walking is good  - some knee pain and sometimes can feel it in his back    Diagnostic tests x-rays    Patient Stated Goals reduce the pain, have easier transitions to stand,    Currently in Pain? Yes   Pain Score 5    Pain Location Back   Pain Orientation Right;Left   Pain Descriptors / Indicators Dull   Pain Type Chronic pain   Pain Onset More than a month ago   Pain Frequency Constant   Aggravating Factors  prolonged postures   Pain Relieving Factors estim   Pain Score 6   Pain Location Back   Pain Orientation Right;Left   Pain Descriptors / Indicators Throbbing   Pain Type Chronic pain   Pain Frequency Intermittent   Aggravating  Factors  stairs   Pain Relieving Factors knee brace; icy hot             [facility] PT Assessment - 09/17/15 0001    Assessment   Medical Diagnosis lumbar spondylosis   Referring Provider Dr. Benjamin Stain   Onset Date/Surgical Date 08/24/13   Next MD Visit 09/20/15   Observation/Other Assessments   Focus on Therapeutic Outcomes (FOTO)  46% limitation    Strength   Right Hip Extension 4+/5   Right Hip ABduction 5/5   Left Hip Extension --  5-/5   Left Hip ABduction 5/5   Flexibility   Hamstrings Rt 60 deg, Lt 52 deg    Quadriceps Rt 120 deg knee flexion, Lt 109 deg knee flexion                      [facility] Adult PT Treatment/Exercise - 09/17/15 0001    Lumbar Exercises: Stretches   Passive Hamstring Stretch 30 seconds;4 reps  2- seated, 2 supine   Single Knee to Chest Stretch 2 reps;30 seconds   Quad Stretch 3 reps;30 seconds  prone with strap   ITB Stretch 2 reps;30 seconds   Lumbar Exercises: Aerobic   Tread Mill 2.0 to 2.8  mph level x 5 min    Lumbar Exercises: Standing   Other Standing Lumbar Exercises functional squat with core engaged x 10 reps.    Other Standing Lumbar Exercises sit to/from stand with core engaged x 10 reps   Lumbar Exercises: Supine   Ab Set 10 reps;5 seconds  VC to rengage core with all ther ex    Clam 10 reps;3 seconds  green TB   Bridge 10 reps;5 seconds   Lumbar Exercises: Sidelying   Clam 10 reps  with PT providing resistance    Hip Abduction 10 reps   Lumbar Exercises: Prone   Straight Leg Raise 5 reps  2 sets each side   Opposite Arm/Leg Raise Right arm/Left leg;Left arm/Right leg;5 reps;1 second  VC for form   Moist Heat Therapy   Number Minutes Moist Heat 20 Minutes   Moist Heat Location Lumbar Spine;Knee   Electrical Stimulation   Electrical Stimulation Location Lumbar   Electrical Stimulation Action IFC   Electrical Stimulation Parameters to tolerance    Electrical Stimulation Goals Pain   Manual Therapy   Soft  tissue mobilization bilat multifi and lumbar paraspinals                 PT Education - 09/17/15 0845    Education provided Yes   Education Details HEP; core stabilization    Person(s) Educated Patient   Methods Explanation;Demonstration;Tactile cues;Verbal cues;Handout   Comprehension Verbalized understanding;Returned demonstration;Verbal cues required;Tactile cues required             PT Long Term Goals - 09/17/15 0808    PT LONG TERM GOAL #1   Title Independent with advanced HEP ( 10/06/15)    Time 6   Period Weeks   Status On-going   PT LONG TERM GOAL #2   Title demo bilat hip extension and abduction strength =/> 5-/5 ( 10/06/15)    Time 6   Period Weeks   Status On-going   PT LONG TERM GOAL #3   Title report over reduction of pain in his back and bilat knees =/> 50% with daily activity ( 10/06/15)    Time 6   Period Weeks   Status On-going   PT LONG TERM GOAL #4   Title transition sit to stand with 50% less pain ( 10/06/15)    Time 6   Period Weeks   Status On-going   PT LONG TERM GOAL #5   Title consistently perform squats with good form ( 10/06/15)    Time 6   Period Weeks   Status On-going   PT LONG TERM GOAL #6   Title improve FOTO =/< 39% limited ( 10/06/15)    Time 6   Period Weeks   Status On-going               Plan - 09/17/15 0803    Clinical Impression Statement Patient tolerated exercise well. Gradually progressing with LBP - not much response to injections for knees to date - but will have the 3rd injectioin Monday. Progressing toward stated goals of therapy.    Rehab Potential Good   PT Frequency 2x / week   PT Duration 6 weeks   PT Treatment/Interventions Ultrasound;Traction;Patient/family education;Passive range of motion;Dry needling;Cryotherapy;Electrical Stimulation;Moist Heat;Therapeutic exercise;Manual techniques;Vasopneumatic Device;Taping   PT Next Visit Plan Continue rehab as indicated    Consulted and Agree with Plan of Care  Patient      Patient will benefit from skilled therapeutic intervention in order to improve  the following deficits and impairments:  Decreased strength, Hypomobility, Impaired flexibility, Pain, Increased muscle spasms, Decreased range of motion  Visit Diagnosis: Bilateral low back pain without sciatica  Muscle weakness (generalized)  Pain in right knee     Problem List Patient Active Problem List   Diagnosis Date Noted  . Presbycusis 09/06/2015  . Primary osteoarthritis of both knees 08/19/2015  . 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 Essential hypertension, benign 08/19/2015  . Prediabetes 08/19/2015  . Obesity 08/19/2015    Sadira Standard Rober MinionP Nomar Broad PT, MPH  09/17/2015, 8:49 AM  [facility] [address] [place] [address] [place], [place], [address] Phone: [phone]   Fax:  [phone]  Name: Jeffrey Burnett MRN: [id number] Date of Birth: Mar 22, 1961

## 2015-09-20 ENCOUNTER — Ambulatory Visit (INDEPENDENT_AMBULATORY_CARE_PROVIDER_SITE_OTHER): Payer: BLUE CROSS/BLUE SHIELD | Admitting: Sports Medicine

## 2015-09-20 VITALS — BP 130/80 | HR 67 | Resp 18 | Wt 238.5 lb

## 2015-09-20 DIAGNOSIS — M170 Bilateral primary osteoarthritis of knee: Secondary | ICD-10-CM

## 2015-09-20 DIAGNOSIS — E669 Obesity, unspecified: Secondary | ICD-10-CM | POA: Diagnosis not present

## 2015-09-20 MED ORDER — MELOXICAM 15 MG PO TABS: ORAL_TABLET | ORAL | Status: DC

## 2015-09-20 MED ORDER — PHENTERMINE HCL 37.5 MG PO TABS: ORAL_TABLET | ORAL | Status: DC

## 2015-09-20 NOTE — Assessment & Plan Note (Signed)
Orthovisc injection #3 of 4 into both knees, return in one week for #4 Refilling meloxicam.

## 2015-09-20 NOTE — Progress Notes (Signed)
  Subjective:    CC: Follow-up  HPI: Knee osteoarthritis: Here for Orthovisc injection #3, still having some soreness.  Obesity: Needs a refill on phentermine, entering the third month, good continued weight loss.  Past medical history, Surgical history, Family history not pertinant except as noted below, Social history, Allergies, and medications have been entered into the medical record, reviewed, and no changes needed.   Review of Systems: No fevers, chills, night sweats, weight loss, chest pain, or shortness of breath.   Objective:    General: Well Developed, well nourished, and in no acute distress.  Neuro: Alert and oriented x3, extra-ocular muscles intact, sensation grossly intact.  HEENT: Normocephalic, atraumatic, pupils equal round reactive to light, neck supple, no masses, no lymphadenopathy, thyroid nonpalpable.  Skin: Warm and dry, no rashes. Cardiac: Regular rate and rhythm, no murmurs rubs or gallops, no lower extremity edema.  Respiratory: Clear to auscultation bilaterally. Not using accessory muscles, speaking in full sentences.  Procedure: Real-time Ultrasound Guided Injection of left knee Device: GE Logiq E  Verbal informed consent obtained.  Time-out conducted.  Noted no overlying erythema, induration, or other signs of local infection.  Skin prepped in a sterile fashion.  Local anesthesia: Topical Ethyl chloride.  With sterile technique and under real time ultrasound guidance:   30 mg/2 mL of OrthoVisc (sodium hyaluronate) in a prefilled syringe was injected easily into the knee through a 22-gauge needle. Completed without difficulty  Pain immediately resolved suggesting accurate placement of the medication.  Advised to call if fevers/chills, erythema, induration, drainage, or persistent bleeding.  Images permanently stored and available for review in the ultrasound unit.  Impression: Technically successful ultrasound guided injection.  Procedure: Real-time  Ultrasound Guided Injection of right knee Device: GE Logiq E  Verbal informed consent obtained.  Time-out conducted.  Noted no overlying erythema, induration, or other signs of local infection.  Skin prepped in a sterile fashion.  Local anesthesia: Topical Ethyl chloride.  With sterile technique and under real time ultrasound guidance:   30 mg/2 mL of OrthoVisc (sodium hyaluronate) in a prefilled syringe was injected easily into the knee through a 22-gauge needle. Completed without difficulty  Pain immediately resolved suggesting accurate placement of the medication.  Advised to call if fevers/chills, erythema, induration, drainage, or persistent bleeding.  Images permanently stored and available for review in the ultrasound unit.  Impression: Technically successful ultrasound guided injection.  Impression and Recommendations:

## 2015-09-20 NOTE — Assessment & Plan Note (Signed)
Good continued weight loss. Entering the third month

## 2015-09-28 ENCOUNTER — Encounter: Payer: Self-pay | Admitting: Sports Medicine

## 2015-09-28 ENCOUNTER — Ambulatory Visit (INDEPENDENT_AMBULATORY_CARE_PROVIDER_SITE_OTHER): Payer: BLUE CROSS/BLUE SHIELD | Admitting: Sports Medicine

## 2015-09-28 VITALS — BP 118/75 | HR 73 | Resp 18 | Wt 242.2 lb

## 2015-09-28 DIAGNOSIS — M170 Bilateral primary osteoarthritis of knee: Secondary | ICD-10-CM

## 2015-09-28 NOTE — Assessment & Plan Note (Signed)
Orthovisc injection #4 of 4 into both knees.

## 2015-09-28 NOTE — Addendum Note (Signed)
Addended by: Baird KayUGLAS, Daaron Dimarco M on: 09/28/2015 08:40 AM   Modules accepted: Medications

## 2015-09-28 NOTE — Progress Notes (Signed)

## 2015-09-29 ENCOUNTER — Ambulatory Visit (INDEPENDENT_AMBULATORY_CARE_PROVIDER_SITE_OTHER): Payer: BLUE CROSS/BLUE SHIELD | Admitting: Physical Therapy

## 2015-09-29 DIAGNOSIS — M545 Low back pain, unspecified: Secondary | ICD-10-CM

## 2015-09-29 DIAGNOSIS — M25561 Pain in right knee: Secondary | ICD-10-CM

## 2015-09-29 DIAGNOSIS — M6281 Muscle weakness (generalized): Secondary | ICD-10-CM

## 2015-09-29 NOTE — Therapy (Addendum)
[place], [place], [address] Phone: [phone]   Fax:  [phone]  Physical Therapy Treatment  Patient Details  Name: Jeffrey Burnett MRN: [id number] Date of Birth: 1960/08/15 Referring Provider: Dr. Dianah Field  Encounter Date: 09/29/2015      PT End of Session - 09/29/15 0718    Visit Number 8   Number of Visits 12   Date for PT Re-Evaluation 10/06/15   PT Start Time 0715   PT Stop Time 0748  pt had to leave early for work   PT Time Calculation (min) 33 min   Activity Tolerance Patient tolerated treatment well      Past Medical History  Diagnosis Date  . Hypertension   . Sleep apnea     Past Surgical History  Procedure Laterality Date  . Hernia repair      hiatal hernia    There were no vitals filed for this visit.      Subjective Assessment - 09/29/15 0718    Subjective Pt is pleased with progress.  Traveled to [place] over last week.  Had very little trouble with back over the trip.  Pt reports he has continued to perform exercises despite travel.   Knees have slightly improved since injections, "not as sore with every day activities"    Pertinent History bilat knee pain - OA , broken sacrum 1991, jumping out of airplane in [employer]   Patient Stated Goals reduce the pain, have easier transitions to stand,    Currently in Pain? Yes   Pain Score 2    Pain Location Knee   Pain Orientation Right;Left   Pain Descriptors / Indicators Dull   Aggravating Factors  squatting, stairs    Pain Relieving Factors sitting, relaxing             [facility] PT Assessment - 09/29/15 0001    Flexibility   Hamstrings Lt 50 deg, Rt 60 deg    Quadriceps Rt 120 deg knee flexion, Lt 115 deg knee flexion           [facility] Adult PT Treatment/Exercise - 09/29/15 0001    Lumbar Exercises: Stretches   Passive Hamstring Stretch 3 reps;30 seconds   Single Knee to Chest Stretch 2 reps;30 seconds    Quad Stretch 3 reps;30 seconds  prone with strap   Lumbar Exercises: Aerobic   Stationary Bike NuStep L4: 4 min    Lumbar Exercises: Prone   Opposite Arm/Leg Raise Right arm/Left leg;Left arm/Right leg;5 reps;1 second   Lumbar Exercises: Quadruped   Opposite Arm/Leg Raise Right arm/Left leg;Left arm/Right leg;5 reps;2 seconds  with 3" foam pad for knee comfort   Moist Heat Therapy   Number Minutes Moist Heat 10 Minutes   Moist Heat Location Lumbar Spine;Knee   Electrical Stimulation   Electrical Stimulation Location Lumbar   Electrical Stimulation Action IFC   Electrical Stimulation Parameters to tolerance    Electrical Stimulation Goals Pain                     PT Long Term Goals - 09/29/15 0741    PT LONG TERM GOAL #1   Title Independent with advanced HEP ( 10/06/15)    Time 6   Period Weeks   Status On-going   PT LONG TERM GOAL #2   Title demo bilat hip extension and abduction strength =/> 5-/5 ( 10/06/15)    Time 6   Period Weeks   Status On-going  PT LONG TERM GOAL #3   Title report over reduction of pain in his back and bilat knees =/> 50% with daily activity ( 10/06/15)    Time 6   Period Weeks   Status Partially Met  reduction in pain in back, not yet knees.    PT LONG TERM GOAL #4   Title transition sit to stand with 50% less pain ( 10/06/15)    Time 6   Period Weeks   Status Achieved   PT LONG TERM GOAL #5   Title consistently perform squats with good form ( 10/06/15)    Time 6   Period Weeks   Status On-going   PT LONG TERM GOAL #6   Title improve FOTO =/< 39% limited ( 10/06/15)    Time 6   Period Weeks   Status On-going               Plan - 09/29/15 2542    Clinical Impression Statement Pt tolerated exercises well;  challenged by quadruped position.  Pt continues to have tight hamstrings and quadriceps (improved Lt quad flexibility this date). Pt has met LTG # 4 and partially met LTG #3.     Rehab Potential Good   PT Frequency 2x / week    PT Duration 6 weeks   PT Treatment/Interventions Ultrasound;Traction;Patient/family education;Passive range of motion;Dry needling;Cryotherapy;Electrical Stimulation;Moist Heat;Therapeutic exercise;Manual techniques;Vasopneumatic Device;Taping   PT Next Visit Plan Continue core strengthening, flexibility for LE.  Modalities as indicated.    Consulted and Agree with Plan of Care Patient      Patient will benefit from skilled therapeutic intervention in order to improve the following deficits and impairments:  Decreased strength, Hypomobility, Impaired flexibility, Pain, Increased muscle spasms, Decreased range of motion  Visit Diagnosis: Bilateral low back pain without sciatica  Muscle weakness (generalized)  Pain in right knee     Problem List Patient Active Problem List   Diagnosis Date Noted  . Presbycusis 09/06/2015  . Primary osteoarthritis of both knees 08/19/2015  . 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 Essential hypertension, benign 08/19/2015  . Prediabetes 08/19/2015  . Obesity 08/19/2015   Kerin Perna, PTA 09/29/2015 7:47 AM   [facility] [place], [place], [address] Phone: [phone]   Fax:  [phone]  Name: Jeffrey Burnett MRN: [id number] Date of Birth: 1960-07-27    PHYSICAL THERAPY DISCHARGE SUMMARY  Visits from Start of Care: 8  Current functional level related to goals / functional outcomes: unknown   Remaining deficits: unknown   Education / Equipment: HEP Plan:                                                    Patient goals were not met. Patient is being discharged due to not returning since the last visit.  ?????    Jeral Pinch, PT 11/03/2015 10:01 AM

## 2015-10-01 ENCOUNTER — Encounter: Payer: BLUE CROSS/BLUE SHIELD | Admitting: Physical Therapy

## 2015-10-18 DIAGNOSIS — H5213 Myopia, bilateral: Secondary | ICD-10-CM | POA: Diagnosis not present

## 2015-10-19 ENCOUNTER — Ambulatory Visit (INDEPENDENT_AMBULATORY_CARE_PROVIDER_SITE_OTHER): Payer: BLUE CROSS/BLUE SHIELD | Admitting: Sports Medicine

## 2015-10-19 ENCOUNTER — Encounter: Payer: Self-pay | Admitting: Sports Medicine

## 2015-10-19 VITALS — BP 120/78 | HR 86 | Wt 232.0 lb

## 2015-10-19 DIAGNOSIS — Z0000 Encounter for general adult medical examination without abnormal findings: Secondary | ICD-10-CM

## 2015-10-19 DIAGNOSIS — E669 Obesity, unspecified: Secondary | ICD-10-CM | POA: Diagnosis not present

## 2015-10-19 LAB — COMPREHENSIVE METABOLIC PANEL WITH GFR
ALT: 24 U/L (ref 9–46)
AST: 22 U/L (ref 10–35)
Albumin: 4.2 g/dL (ref 3.6–5.1)
BUN: 18 mg/dL (ref 7–25)
Calcium: 9.5 mg/dL (ref 8.6–10.3)
Glucose, Bld: 106 mg/dL — ABNORMAL HIGH (ref 65–99)
Sodium: 141 mmol/L (ref 135–146)
Total Protein: 7.3 g/dL (ref 6.1–8.1)

## 2015-10-19 LAB — CBC
HCT: 40.7 % (ref 38.5–50.0)
Hemoglobin: 13.5 g/dL (ref 13.2–17.1)
MCH: 29.7 pg (ref 27.0–33.0)
MCHC: 33.2 g/dL (ref 32.0–36.0)
MCV: 89.5 fL (ref 80.0–100.0)
MPV: 10.1 fL (ref 7.5–12.5)
Platelets: 279 10*3/uL (ref 140–400)
RBC: 4.55 MIL/uL (ref 4.20–5.80)
RDW: 13.5 % (ref 11.0–15.0)
WBC: 7.2 K/uL (ref 3.8–10.8)

## 2015-10-19 LAB — COMPREHENSIVE METABOLIC PANEL
Alkaline Phosphatase: 61 U/L (ref 40–115)
CO2: 31 mmol/L (ref 20–31)
Chloride: 104 mmol/L (ref 98–110)
Creat: 1.26 mg/dL (ref 0.70–1.33)
Potassium: 4.7 mmol/L (ref 3.5–5.3)
Total Bilirubin: 0.3 mg/dL (ref 0.2–1.2)

## 2015-10-19 LAB — HEMOGLOBIN A1C
Hgb A1c MFr Bld: 6.2 % — ABNORMAL HIGH (ref <5.7)
Mean Plasma Glucose: 131 mg/dL

## 2015-10-19 LAB — LIPID PANEL
Cholesterol: 152 mg/dL (ref 125–200)
HDL: 77 mg/dL (ref >=40)
LDL Cholesterol: 63 mg/dL (ref <130)
Total CHOL/HDL Ratio: 2 Ratio (ref <=5.0)
Triglycerides: 60 mg/dL (ref <150)
VLDL: 12 mg/dL (ref <30)

## 2015-10-19 LAB — TSH: TSH: 1.75 mIU/L (ref 0.40–4.50)

## 2015-10-19 MED ORDER — PHENTERMINE HCL 37.5 MG PO TABS: ORAL_TABLET | ORAL | Status: DC

## 2015-10-19 NOTE — Assessment & Plan Note (Signed)
Patient return for physical, we did discuss the pros and cons of PSA screening, ordering routine blood work. We will proceed with PSA testing.

## 2015-10-19 NOTE — Assessment & Plan Note (Signed)
Good continued weight loss, entering the fourth month.

## 2015-10-19 NOTE — Progress Notes (Signed)
  Subjective:    CC: Weight check  HPI: Jeffrey Burnett returns, he continues to lose weight, no complaints.  Past medical history, Surgical history, Family history not pertinant except as noted below, Social history, Allergies, and medications have been entered into the medical record, reviewed, and no changes needed.   Review of Systems: No fevers, chills, night sweats, weight loss, chest pain, or shortness of breath.   Objective:    General: Well Developed, well nourished, and in no acute distress.  Neuro: Alert and oriented x3, extra-ocular muscles intact, sensation grossly intact.  HEENT: Normocephalic, atraumatic, pupils equal round reactive to light, neck supple, no masses, no lymphadenopathy, thyroid nonpalpable.  Skin: Warm and dry, no rashes. Cardiac: Regular rate and rhythm, no murmurs rubs or gallops, no lower extremity edema.  Respiratory: Clear to auscultation bilaterally. Not using accessory muscles, speaking in full sentences.  Impression and Recommendations:

## 2015-10-20 LAB — PSA, TOTAL AND FREE
PSA, Free Pct: 23 % — ABNORMAL LOW (ref >25)
PSA, Free: 0.19 ng/mL
PSA: 0.81 ng/mL (ref <=4.00)

## 2015-10-20 LAB — VITAMIN D 25 HYDROXY (VIT D DEFICIENCY, FRACTURES): Vit D, 25-Hydroxy: 15 ng/mL — ABNORMAL LOW (ref 30–100)

## 2015-10-20 MED ORDER — VITAMIN D (ERGOCALCIFEROL) 1.25 MG (50000 UNIT) PO CAPS: 50000.0000 [IU] | ORAL_CAPSULE | ORAL | Status: DC

## 2015-10-20 NOTE — Addendum Note (Signed)
Addended by: Monica BectonHEKKEKANDAM, Jermie Hippe J on: 10/20/2015 09:41 AM   Modules accepted: Orders

## 2015-10-26 ENCOUNTER — Ambulatory Visit (INDEPENDENT_AMBULATORY_CARE_PROVIDER_SITE_OTHER): Payer: BLUE CROSS/BLUE SHIELD | Admitting: Sports Medicine

## 2015-10-26 ENCOUNTER — Encounter: Payer: Self-pay | Admitting: Sports Medicine

## 2015-10-26 VITALS — BP 121/78 | HR 74 | Resp 16 | Ht 70.75 in | Wt 230.2 lb

## 2015-10-26 DIAGNOSIS — Z0000 Encounter for general adult medical examination without abnormal findings: Secondary | ICD-10-CM

## 2015-10-26 DIAGNOSIS — R011 Cardiac murmur, unspecified: Secondary | ICD-10-CM | POA: Diagnosis not present

## 2015-10-26 DIAGNOSIS — Z1211 Encounter for screening for malignant neoplasm of colon: Secondary | ICD-10-CM

## 2015-10-26 DIAGNOSIS — I340 Nonrheumatic mitral (valve) insufficiency: Secondary | ICD-10-CM | POA: Insufficient documentation

## 2015-10-26 LAB — HEMOCCULT GUIAC POC 1CARD (OFFICE): Fecal Occult Blood, POC: NEGATIVE

## 2015-10-26 NOTE — Assessment & Plan Note (Signed)
Murmur sounds like an aortic stenosis murmur, it is very mild, 1/6, I'm going to order an updated echocardiogram. Patient is asymptomatic.

## 2015-10-26 NOTE — Assessment & Plan Note (Signed)
Annual physical as above.  

## 2015-10-26 NOTE — Addendum Note (Signed)
Addended by: Baird KayUGLAS, Islah Eve M on: 10/26/2015 09:15 AM   Modules accepted: Orders

## 2015-10-26 NOTE — Progress Notes (Signed)
  Subjective:    CC: Annual physical   HPI:  Jeffrey Burnett is here for his physical, he has no complaints.  Past medical history, Surgical history, Family history not pertinant except as noted below, Social history, Allergies, and medications have been entered into the medical record, reviewed, and no changes needed.   Review of Systems: No headache, visual changes, nausea, vomiting, diarrhea, constipation, dizziness, abdominal pain, skin rash, fevers, chills, night sweats, swollen lymph nodes, weight loss, chest pain, body aches, joint swelling, muscle aches, shortness of breath, mood changes, visual or auditory hallucinations.  Objective:    General: Well Developed, well nourished, and in no acute distress.  Neuro: Alert and oriented x3, extra-ocular muscles intact, sensation grossly intact. Cranial nerves II through XII are intact, motor, sensory, and coordinative functions are all intact. HEENT: Normocephalic, atraumatic, pupils equal round reactive to light, neck supple, no masses, no lymphadenopathy, thyroid nonpalpable. Oropharynx, nasopharynx, external ear canals are unremarkable. Skin: Warm and dry, no rashes noted.  Cardiac: Regular rate and rhythm, no murmurs rubs or gallops.  Respiratory: Clear to auscultation bilaterally. Not using accessory muscles, speaking in full sentences.  Abdominal: Soft, nontender, nondistended, positive bowel sounds, no masses, no organomegaly.  Musculoskeletal: Shoulder, elbow, wrist, hip, knee, ankle stable, and with full range of motion.  Impression and Recommendations:    The patient was counselled, risk factors were discussed, anticipatory guidance given.

## 2015-11-03 ENCOUNTER — Ambulatory Visit (HOSPITAL_BASED_OUTPATIENT_CLINIC_OR_DEPARTMENT_OTHER)
Admission: RE | Admit: 2015-11-03 | Discharge: 2015-11-03 | Disposition: A | Discharge status: Home / Self Care | Payer: BLUE CROSS/BLUE SHIELD | Source: Ambulatory Visit | Attending: Sports Medicine | Admitting: Sports Medicine

## 2015-11-03 DIAGNOSIS — I340 Nonrheumatic mitral (valve) insufficiency: Secondary | ICD-10-CM | POA: Insufficient documentation

## 2015-11-03 DIAGNOSIS — I517 Cardiomegaly: Secondary | ICD-10-CM | POA: Insufficient documentation

## 2015-11-03 DIAGNOSIS — R011 Cardiac murmur, unspecified: Secondary | ICD-10-CM | POA: Diagnosis not present

## 2015-11-03 LAB — ECHOCARDIOGRAM COMPLETE
E decel time: 236 ms
E/e' ratio: 8.78
FS: 32 % (ref 28–44)
IVS/LV PW RATIO, ED: 1.09
LA ID, A-P, ES: 35 mm
LA diam end sys: 35 mm
LA diam index: 1.57 cm/m2
LA vol A4C: 63 ml
LA vol index: 30.9 mL/m2
LA vol: 68.8 mL
LV E/e' medial: 8.78
LV E/e'average: 8.78
LV PW d: 12.8 mm — AB (ref 0.6–1.1)
LV dias vol index: 32 mL/m2
LV dias vol: 72 mL (ref 62–150)
LV e' LATERAL: 8.45 cm/s
LV sys vol index: 11 mL/m2
LV sys vol: 25 mL (ref 21–61)
LVOT SV: 122 mL
LVOT VTI: 29.4 cm
LVOT area: 4.15 cm2
LVOT diameter: 23 mm
LVOT peak grad rest: 8 mmHg
LVOT peak vel: 139 cm/s
MV Dec: 236
MV Peak grad: 2 mmHg
MV pk A vel: 56.3 m/s
MV pk E vel: 74.2 m/s
Simpson's disk: 65
Stroke v: 47 ml
TAPSE: 25.3 mm
TDI e' lateral: 8.45
TDI e' medial: 7.21

## 2015-11-03 NOTE — Progress Notes (Signed)
Echocardiogram 2D Echocardiogram has been performed.  Dorothey BasemanReel, Aireona Torelli M 11/03/2015, 2:49 PM

## 2015-11-04 ENCOUNTER — Encounter: Payer: Self-pay | Admitting: Sports Medicine

## 2015-11-17 ENCOUNTER — Encounter: Payer: Self-pay | Admitting: Sports Medicine

## 2015-11-17 ENCOUNTER — Ambulatory Visit (INDEPENDENT_AMBULATORY_CARE_PROVIDER_SITE_OTHER): Payer: BLUE CROSS/BLUE SHIELD | Admitting: Sports Medicine

## 2015-11-17 ENCOUNTER — Ambulatory Visit: Payer: BLUE CROSS/BLUE SHIELD | Admitting: Sports Medicine

## 2015-11-17 VITALS — BP 115/73 | HR 83 | Resp 18 | Wt 224.1 lb

## 2015-11-17 DIAGNOSIS — G44209 Tension-type headache, unspecified, not intractable: Secondary | ICD-10-CM | POA: Insufficient documentation

## 2015-11-17 DIAGNOSIS — G44229 Chronic tension-type headache, not intractable: Secondary | ICD-10-CM | POA: Diagnosis not present

## 2015-11-17 DIAGNOSIS — E669 Obesity, unspecified: Secondary | ICD-10-CM | POA: Diagnosis not present

## 2015-11-17 MED ORDER — PHENTERMINE HCL 37.5 MG PO TABS: ORAL_TABLET | ORAL | Status: DC

## 2015-11-17 NOTE — Assessment & Plan Note (Signed)
May use over-the-counter analgesics and anti-inflammatories, no features consistent with migraine, cluster headache, no ominous features or red flags.

## 2015-11-17 NOTE — Progress Notes (Signed)
  Subjective:    CC: Follow-up  HPI: Obesity: Good continued weight loss, no side effects, no questions.  Headaches: Present for years, dull headaches on the anterior forehead, not throbbing, no photophobia, phonophobia, no nausea, last for minutes to hours. Typically resolves with Excedrin. No aura.  Past medical history, Surgical history, Family history not pertinant except as noted below, Social history, Allergies, and medications have been entered into the medical record, reviewed, and no changes needed.   Review of Systems: No fevers, chills, night sweats, weight loss, chest pain, or shortness of breath.   Objective:    General: Well Developed, well nourished, and in no acute distress.  Neuro: Alert and oriented x3, extra-ocular muscles intact, sensation grossly intact. Cranial nerves II through XII are intact, motor, sensory, coordinative functions are all intact. HEENT: Normocephalic, atraumatic, pupils equal round reactive to light, neck supple, no masses, no lymphadenopathy, thyroid nonpalpable.  Skin: Warm and dry, no rashes. Cardiac: Regular rate and rhythm, no murmurs rubs or gallops, no lower extremity edema.  Respiratory: Clear to auscultation bilaterally. Not using accessory muscles, speaking in full sentences.  Impression and Recommendations:

## 2015-11-17 NOTE — Assessment & Plan Note (Signed)
Good continued weight loss as we enter the fifth month of phentermine, refilling medication, return in one month.

## 2015-11-23 ENCOUNTER — Ambulatory Visit: Payer: BLUE CROSS/BLUE SHIELD | Admitting: Sports Medicine

## 2015-11-30 ENCOUNTER — Ambulatory Visit: Payer: BLUE CROSS/BLUE SHIELD | Attending: Sports Medicine | Admitting: Audiology

## 2015-11-30 DIAGNOSIS — H93293 Other abnormal auditory perceptions, bilateral: Secondary | ICD-10-CM | POA: Diagnosis not present

## 2015-11-30 DIAGNOSIS — R29818 Other symptoms and signs involving the nervous system: Secondary | ICD-10-CM | POA: Insufficient documentation

## 2015-11-30 DIAGNOSIS — H9325 Central auditory processing disorder: Secondary | ICD-10-CM | POA: Insufficient documentation

## 2015-11-30 DIAGNOSIS — R2689 Other abnormalities of gait and mobility: Secondary | ICD-10-CM

## 2015-11-30 DIAGNOSIS — R292 Abnormal reflex: Secondary | ICD-10-CM | POA: Diagnosis present

## 2015-11-30 NOTE — Procedures (Signed)
Outpatient Audiology and [facility]  [address]  [place], [place] [address]  [phone]   Audiological Evaluation  Patient Name: Jeffrey Burnett   Status: Outpatient   DOB: [birth year]    Diagnosis: Presbycusis, bilateral MRN: [id number] Date:  11/30/2015     Referent: Jeffrey Langton, MD  History: Jeffrey Burnett was seen for an audiological evaluation .  His primary concerns are "difficulty with word understanding in both ears at work and at home, a feeling that sometimes something is in my ear, feeling off-balance when standing up with brief periods of vertigo (lasting seconds) when turning over in bed at night".  Significant is that Jeffrey Burnett reports intermittent "ear aches" as recently as "last weekend".  Jeffrey Burnett states that he has "intermittent, piercing tinnitus" that he rates as a "5" on a scale of 1-10.   Jeffrey Burnett states that his wife complains that he "turns the TV up loud".  Jeffrey Burnett states that he is currently being treated for hypertension and is pre-diabetic. 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    Evaluation: Conventional pure tone audiometry from 250Hz  - 8000Hz  with using insert earphones shows symmetrical hearing thresholds of 20-25 dBHL. The hearing loss appears sensorineural bilaterally. Reliability is good Speech reception levels (repeating words near threshold) using recorded spondee word lists:  Right ear: 20 dBHL.  Left ear:  20 dBHL Word recognition (at comfortably loud volumes) using recorded word lists is 100% at 60 dBHL in each ear , in quiet.  Word recognition in minimal background noise:  +5 dBHL  Right ear: 52%                              Left ear:  26%  Tympanometry (middle ear function) shows normal middle ear volume, pressure and compliance bilaterally (Type A) with present ipsilateral acoustic reflexes of 85-90 dB  bilaterally from 500Hz  - 4000Hz - except on the left side with 95dB at 2000hz  and no response at 4000Hz .  Acoustic reflex decay was negative bilaterally.   Distortion Product Otoacoustic Emissions (DPOAE's), a test of inner ear function was completed from 2000Hz  - 10,000Hz  with present responses throughout the range supporting good outer hair cell function in the cochlea bilaterally.    CONCLUSION:      Jeffrey Burnett has borderline normal to a slight sensorineural hearing loss bilaterally with normal middle ear pressure, acoustic reflex decay and inner ear function bilaterally; however the left high frequency acoustic reflexes are abnormal.  Word recognition is excellent in each ear at normal conversational speech levels in quiet, but in minimal background noise, word recognition drops to poor in the right ear and very poor in the left ear.    Because poor word recognition in background noise may be a "red flag" of Central Auditory Processing Disorder (CAPD) additional testing was completed.  The Central Auditory Processing tests were very abnormal and although it is possible that Jeffrey Burnett has had CAPD, his history does not confirm it.  Of concern is that the CAPD is of recent onset.  For example, on the Phonemic Synthesis test of decoding and sound blending Jeffrey Burnett had no correct responses (normal for a 8-76 year old is 17 correct responses).  For example blending together /e - ch/ he said /eat instead of each/ - this is very unusual because abnormal on  this test is generally linked to poor reading and spelling, but Jeffrey Burnett states that he "reads at night" and "finished college".  Competing Sentences requires ignoring one ear while repeating the quieter sentences in the other. Normal for a 55 year old is 100% correct in each ear, however, Jeffrey Burnett scored abnormal in each ear <40% on the right and <20% on the left. Abnormal on this test suggests greatly increased difficulty listening when  competing message are present because his ability to ignore background noise is diminished.  This is verified with Jeffrey Burnett where Jeffrey Burnett repeated 45% correct on the left and 80% on the right, with norms of 90% or greater.   As discussed with Jeffrey Burnett, further evaluation is needed and close monitoring of his hearing is recommended: 1) Since he has vertigo in bed when turning over, he needs BPPV ruled out or treated. 2) The very poor Jeffrey Burnett results are of concern because recent onset must be ruled out, but Jeffrey Burnett also reports having "headaches" - consider further evaluation by an ENT and/or neurologist.  Finally, to help provide additional information, consideration of an at home auditory processing program was discussed to determine whether skills such as phonemic synthesis and decoding may be improved. Information is provided in case Jeffrey Burnett is interested in trying a proactive measure.   Benefit has been shown with intensive use for 10-15 minutes,  4-5 days per week. Research is suggesting that using the programs for a short amount of time each day is better for the auditory processing development than completing the program in a short amount of time by doing it several hours per day.             [email or web address]   Phonological Awareness or    LACE  (teenage to adult)     PC download or cd                                                    [email or web address]  RECOMMENDATIONS: 1.   Consider referral to physical therapy balance assessment to evaluate and treat BPPV- available at [facility]. 2.   Consider further evaluation by an ENT and/or neurologist because of the unusually central auditory processing findings that include poor word recognition in background noise.  By history, it does not seem that Jeffrey Burnett had poor auditory processing when he was younger.  3.   Need to  closely monitor hearing with a repeat hearing evaluation in 3-6 months to rule out a progressive issue - a) elevated acoustic reflexes on the left side b) poor word recognition in background noise and c) abnormal auditory processing.  Please schedule this appointment here or with an ENT. 4.  Strategies that help improve hearing include: A) Face the speaker directly. Optimal is having the speakers face well - lit.  Unless amplified, being within 3-6 feet of the speaker will enhance word recognition. B) Avoid having the speaker back-lit as this will minimize the ability to use cues from lip-reading, facial expression and gestures. C)  Word recognition is poorer in background noise. For optimal word recognition, turn off the TV, radio or noisy fan when engaging in conversation. In a restaurant, try to sit away from noise sources and close to the primary  speaker.  D)  Ask for topic clarification from time to time in order to remain in the conversation.  Most people don't mind repeating or clarifying a point when asked.  If needed, explain the difficulty hearing in background noise or hearing loss.  Deborah L. Kate Sable, Au.D., CCC-A Doctor of Audiology 11/30/2015

## 2015-12-15 ENCOUNTER — Encounter: Payer: Self-pay | Admitting: Sports Medicine

## 2015-12-15 ENCOUNTER — Ambulatory Visit (INDEPENDENT_AMBULATORY_CARE_PROVIDER_SITE_OTHER): Payer: BLUE CROSS/BLUE SHIELD | Admitting: Sports Medicine

## 2015-12-15 DIAGNOSIS — H9113 Presbycusis, bilateral: Secondary | ICD-10-CM

## 2015-12-15 DIAGNOSIS — E669 Obesity, unspecified: Secondary | ICD-10-CM | POA: Diagnosis not present

## 2015-12-15 MED ORDER — PHENTERMINE HCL 37.5 MG PO TABS: ORAL_TABLET | ORAL | 0 refills | Status: DC

## 2015-12-15 NOTE — Progress Notes (Signed)
  Subjective:    CC: Follow-up  HPI: Obesity: Good additional weight loss.  Hearing loss: Has seen the audiologist, ultimate plan was to obtain directional speakers.  Past medical history, Surgical history, Family history not pertinant except as noted below, Social history, Allergies, and medications have been entered into the medical record, reviewed, and no changes needed.   Review of Systems: No fevers, chills, night sweats, weight loss, chest pain, or shortness of breath.   Objective:    General: Well Developed, well nourished, and in no acute distress.  Neuro: Alert and oriented x3, extra-ocular muscles intact, sensation grossly intact.  HEENT: Normocephalic, atraumatic, pupils equal round reactive to light, neck supple, no masses, no lymphadenopathy, thyroid nonpalpable.  Skin: Warm and dry, no rashes. Cardiac: Regular rate and rhythm, no murmurs rubs or gallops, no lower extremity edema.  Respiratory: Clear to auscultation bilaterally. Not using accessory muscles, speaking in full sentences.  Impression and Recommendations:    Obesity Continued weight loss as we enter the sixth month, return in one month.  Presbycusis Audiologist has recommended directional speakers.

## 2015-12-15 NOTE — Assessment & Plan Note (Signed)
Continued weight loss as we enter the sixth month, return in one month.

## 2015-12-15 NOTE — Assessment & Plan Note (Signed)
Audiologist has recommended directional speakers.

## 2016-01-14 ENCOUNTER — Ambulatory Visit: Payer: BLUE CROSS/BLUE SHIELD | Admitting: Sports Medicine

## 2016-02-03 ENCOUNTER — Ambulatory Visit (INDEPENDENT_AMBULATORY_CARE_PROVIDER_SITE_OTHER): Payer: BLUE CROSS/BLUE SHIELD | Admitting: Sports Medicine

## 2016-02-03 DIAGNOSIS — Z6830 Body mass index (BMI) 30.0-30.9, adult: Secondary | ICD-10-CM | POA: Diagnosis not present

## 2016-02-03 DIAGNOSIS — E6609 Other obesity due to excess calories: Secondary | ICD-10-CM | POA: Diagnosis not present

## 2016-02-03 MED ORDER — PHENTERMINE HCL 37.5 MG PO TABS: ORAL_TABLET | ORAL | 0 refills | Status: DC

## 2016-02-03 NOTE — Progress Notes (Signed)
  Subjective:    CC: Weight check  HPI: This is a pleasant 55 year old male, we finished 6 months of full dose phentermine, he's 35 pounds down and close to his goal weight, very happy with results.  Past medical history:  Negative.  See flowsheet/record as well for more information.  Surgical history: Negative.  See flowsheet/record as well for more information.  Family history: Negative.  See flowsheet/record as well for more information.  Social history: Negative.  See flowsheet/record as well for more information.  Allergies, and medications have been entered into the medical record, reviewed, and no changes needed.   Review of Systems: No fevers, chills, night sweats, weight loss, chest pain, or shortness of breath.   Objective:    General: Well Developed, well nourished, and in no acute distress.  Neuro: Alert and oriented x3, extra-ocular muscles intact, sensation grossly intact.  HEENT: Normocephalic, atraumatic, pupils equal round reactive to light, neck supple, no masses, no lymphadenopathy, thyroid nonpalpable.  Skin: Warm and dry, no rashes. Cardiac: Regular rate and rhythm, no murmurs rubs or gallops, no lower extremity edema.  Respiratory: Clear to auscultation bilaterally. Not using accessory muscles, speaking in full sentences.  Impression and Recommendations:    Obesity 35 pound weight loss over 6 months, dropping to half dose phentermine, return in 3 months. Goal weight is below 200 pounds.

## 2016-02-03 NOTE — Assessment & Plan Note (Signed)
35 pound weight loss over 6 months, dropping to half dose phentermine, return in 3 months. Goal weight is below 200 pounds.

## 2016-04-19 ENCOUNTER — Encounter: Payer: Self-pay | Admitting: Sports Medicine

## 2016-04-19 ENCOUNTER — Telehealth: Payer: Self-pay | Admitting: Sports Medicine

## 2016-04-19 ENCOUNTER — Ambulatory Visit (INDEPENDENT_AMBULATORY_CARE_PROVIDER_SITE_OTHER): Payer: BLUE CROSS/BLUE SHIELD | Admitting: Sports Medicine

## 2016-04-19 DIAGNOSIS — I10 Essential (primary) hypertension: Secondary | ICD-10-CM | POA: Diagnosis not present

## 2016-04-19 DIAGNOSIS — M170 Bilateral primary osteoarthritis of knee: Secondary | ICD-10-CM

## 2016-04-19 MED ORDER — IRBESARTAN-HYDROCHLOROTHIAZIDE 150-12.5 MG PO TABS: 1.0000 | ORAL_TABLET | Freq: Every day | ORAL | 3 refills | Status: DC

## 2016-04-19 NOTE — Progress Notes (Signed)
  Subjective:    CC: Bilateral knee pain  HPI: This is a pleasant 55 year old male, he has known bilateral knee osteoarthritis, did well for 8 months after Orthovisc series, having recurrence pain at the medial joint line, moderate, persistent without radiation. No mechanical symptoms, no trauma.  Past medical history:  Negative.  See flowsheet/record as well for more information.  Surgical history: Negative.  See flowsheet/record as well for more information.  Family history: Negative.  See flowsheet/record as well for more information.  Social history: Negative.  See flowsheet/record as well for more information.  Allergies, and medications have been entered into the medical record, reviewed, and no changes needed.   Review of Systems: No fevers, chills, night sweats, weight loss, chest pain, or shortness of breath.   Objective:    General: Well Developed, well nourished, and in no acute distress.  Neuro: Alert and oriented x3, extra-ocular muscles intact, sensation grossly intact.  HEENT: Normocephalic, atraumatic, pupils equal round reactive to light, neck supple, no masses, no lymphadenopathy, thyroid nonpalpable.  Skin: Warm and dry, no rashes. Cardiac: Regular rate and rhythm, no murmurs rubs or gallops, no lower extremity edema.  Respiratory: Clear to auscultation bilaterally. Not using accessory muscles, speaking in full sentences. Bilateral knees: Normal to inspection with no erythema or effusion or obvious bony abnormalities. Tender to palpation at the medial joint lines. ROM normal in flexion and extension and lower leg rotation. Ligaments with solid consistent endpoints including ACL, PCL, LCL, MCL. Negative Mcmurray's and provocative meniscal tests. Non painful patellar compression. Patellar and quadriceps tendons unremarkable. Hamstring and quadriceps strength is normal.  Procedure: Real-time Ultrasound Guided Injection of left knee Device: GE Logiq E  Verbal  informed consent obtained.  Time-out conducted.  Noted no overlying erythema, induration, or other signs of local infection.  Skin prepped in a sterile fashion.  Local anesthesia: Topical Ethyl chloride.  With sterile technique and under real time ultrasound guidance:  1 mL kenalog 40, 2 mL lidocaine, 2 mL Marcaine injected easily Completed without difficulty  Pain immediately resolved suggesting accurate placement of the medication.  Advised to call if fevers/chills, erythema, induration, drainage, or persistent bleeding.  Images permanently stored and available for review in the ultrasound unit.  Impression: Technically successful ultrasound guided injection.  Procedure: Real-time Ultrasound Guided Injection of right knee Device: GE Logiq E  Verbal informed consent obtained.  Time-out conducted.  Noted no overlying erythema, induration, or other signs of local infection.  Skin prepped in a sterile fashion.  Local anesthesia: Topical Ethyl chloride.  With sterile technique and under real time ultrasound guidance:  1 mL kenalog 40, 2 mL lidocaine, 2 mL Marcaine injected easily Completed without difficulty  Pain immediately resolved suggesting accurate placement of the medication.  Advised to call if fevers/chills, erythema, induration, drainage, or persistent bleeding.  Images permanently stored and available for review in the ultrasound unit.  Impression: Technically successful ultrasound guided injection.  Impression and Recommendations:    Primary osteoarthritis of both knees Orthovisc completed 8 months ago. We are going to get this reapproved, I'm also going to do bilateral steroid injections today. Return to see me to restart Orthovisc series.  Essential hypertension, benign Controlled, refilling medication.

## 2016-04-19 NOTE — Assessment & Plan Note (Signed)
Controlled, refilling medication 

## 2016-04-19 NOTE — Telephone Encounter (Signed)
-----   Message from Monica Bectonhomas J Thekkekandam, MD sent at 04/19/2016  8:56 AM EST ----- Good response to Orthovisc 8 months ago, needs preapproval for both knees. ___________________________________________ Ihor Austinhomas J. Benjamin Stainhekkekandam, M.D., ABFM., CAQSM. Primary Care and Sports Medicine [facility]  Adjunct Instructor of Family Medicine  [employer]

## 2016-04-19 NOTE — Telephone Encounter (Signed)
Submitted for approval on Orthovisc. Awaiting confirmation.  

## 2016-04-19 NOTE — Assessment & Plan Note (Signed)
Orthovisc completed 8 months ago. We are going to get this reapproved, I'm also going to do bilateral steroid injections today. Return to see me to restart Orthovisc series.

## 2016-04-20 NOTE — Telephone Encounter (Signed)
Received the following information from OV benefits investigation:  Patient has Self Funded PPO plan with an effective date of 10/30/2015. Plan renews on 05/01/16. Benefits for dates of service after 05/01/16 may vary. We recommend you resubmit for dates of service after 05/01/16. Orthovisc is not the preferred drug through [facility] and will not be covered until patient has tried and failed with the preferred drug. Pre-Cert would be required after trying and failing with preferred, Pre-Cert phone # is [phone] / [phone]. [id number] is covered at 100% & [id number]/20611 is covered at 100% of the contracted rate when performed in an office setting. *Deductible & Out of Pocket both have been met. Reference: [id number]  Spoke with Pt, advised we will resubmit after 05/01/16.

## 2016-05-08 ENCOUNTER — Ambulatory Visit: Payer: BLUE CROSS/BLUE SHIELD | Admitting: Sports Medicine

## 2016-05-09 NOTE — Telephone Encounter (Signed)
Submitted for approval on Orthovisc. Awaiting confirmation.  

## 2016-05-11 DIAGNOSIS — M25761 Osteophyte, right knee: Secondary | ICD-10-CM | POA: Diagnosis not present

## 2016-05-11 DIAGNOSIS — M5136 Other intervertebral disc degeneration, lumbar region: Secondary | ICD-10-CM | POA: Diagnosis not present

## 2016-05-11 DIAGNOSIS — M25762 Osteophyte, left knee: Secondary | ICD-10-CM | POA: Diagnosis not present

## 2016-05-16 MED ORDER — SODIUM HYALURONATE (VISCOSUP) 20 MG/2ML IX SOSY: 1.0000 | PREFILLED_SYRINGE | INTRA_ARTICULAR | 0 refills | Status: DC

## 2016-05-16 NOTE — Telephone Encounter (Signed)
Done

## 2016-05-16 NOTE — Addendum Note (Signed)
Addended by: Monica BectonHEKKEKANDAM, THOMAS J on: 05/16/2016 12:43 PM   Modules accepted: Orders

## 2016-05-16 NOTE — Telephone Encounter (Signed)
Received the following information from OV benefits investigation:   Patient has PPO plan with an effective date of 10/30/15. Ov/mv is not the preferred drug through [facility] and will not be covered until patient has tried and failed with the preferred drug. Pre-cert would be required after trying and failing with preferred, pre-cert phone # is [phone].. [id number] is covered at 100% [id number] is covered at 100% of contracted rate when performed in office setting. Deductible must be met coverage to apply. if out of pocket is met, copay will no longer apply. $30.00 copay applies, if billed. Ref# [id number]  [facility], spoke with Cyndia Bent. Was advised that Euflexxa is the preferred drug. Will route to Provider for order. [facility].   Left detailed VM to notify Pt of current order status.

## 2016-06-14 ENCOUNTER — Other Ambulatory Visit: Payer: Self-pay

## 2016-06-14 DIAGNOSIS — M170 Bilateral primary osteoarthritis of knee: Secondary | ICD-10-CM

## 2016-06-14 MED ORDER — SODIUM HYALURONATE (VISCOSUP) 20 MG/2ML IX SOSY: 1.0000 | PREFILLED_SYRINGE | INTRA_ARTICULAR | 0 refills | Status: DC

## 2016-08-07 DIAGNOSIS — L731 Pseudofolliculitis barbae: Secondary | ICD-10-CM | POA: Diagnosis not present

## 2016-08-07 DIAGNOSIS — I10 Essential (primary) hypertension: Secondary | ICD-10-CM | POA: Diagnosis not present

## 2016-09-05 DIAGNOSIS — H2513 Age-related nuclear cataract, bilateral: Secondary | ICD-10-CM | POA: Diagnosis not present

## 2016-09-05 DIAGNOSIS — H527 Unspecified disorder of refraction: Secondary | ICD-10-CM | POA: Diagnosis not present

## 2016-09-05 DIAGNOSIS — H18413 Arcus senilis, bilateral: Secondary | ICD-10-CM | POA: Diagnosis not present

## 2016-09-05 DIAGNOSIS — H3554 Dystrophies primarily involving the retinal pigment epithelium: Secondary | ICD-10-CM | POA: Diagnosis not present

## 2016-09-06 ENCOUNTER — Encounter: Payer: Self-pay | Admitting: Sports Medicine

## 2016-09-06 ENCOUNTER — Ambulatory Visit (INDEPENDENT_AMBULATORY_CARE_PROVIDER_SITE_OTHER): Payer: BLUE CROSS/BLUE SHIELD | Admitting: Sports Medicine

## 2016-09-06 DIAGNOSIS — Z6830 Body mass index (BMI) 30.0-30.9, adult: Secondary | ICD-10-CM | POA: Diagnosis not present

## 2016-09-06 DIAGNOSIS — E6609 Other obesity due to excess calories: Secondary | ICD-10-CM | POA: Diagnosis not present

## 2016-09-06 DIAGNOSIS — M47816 Spondylosis without myelopathy or radiculopathy, lumbar region: Secondary | ICD-10-CM | POA: Diagnosis not present

## 2016-09-06 DIAGNOSIS — M170 Bilateral primary osteoarthritis of knee: Secondary | ICD-10-CM | POA: Diagnosis not present

## 2016-09-06 MED ORDER — PHENTERMINE HCL 37.5 MG PO TABS: ORAL_TABLET | ORAL | 0 refills | Status: DC

## 2016-09-06 NOTE — Assessment & Plan Note (Signed)
Patient will restart home rehabilitation exercises, he did well for one year ago with physical therapy. Pain is axial and discogenic today.

## 2016-09-06 NOTE — Assessment & Plan Note (Addendum)
Repeat steroid injection, I'm also going to get him set up again for Orthovisc.

## 2016-09-06 NOTE — Assessment & Plan Note (Signed)
Restarting phentermine, return monthly for weight checks and refills. 

## 2016-09-06 NOTE — Telephone Encounter (Signed)
Submitted for approval on Orthovisc. Awaiting confirmation.  

## 2016-09-06 NOTE — Telephone Encounter (Signed)
Would you please look back into OV bilateral.  Epic is saying its preferred level 1. ___________________________________________ Ihor Austinhomas J. Benjamin Stainhekkekandam, M.D., ABFM., CAQSM. Primary Care and Sports Medicine [facility]  Adjunct Instructor of Family Medicine  [employer]

## 2016-09-06 NOTE — Progress Notes (Signed)
  Subjective:    CC: Follow-up  HPI: Bilateral knee osteoarthritis: Did well with Orthovisc in the distant past, previous steroid injection was 5 months ago, recurrence of pain, moderate, persistent, localized at the joint lines without radiation. Desires repeat interventional treatment today.  Back pain: Axial, discogenic, worse with sitting, flexion, Valsalva, nothing radicular, no bowel or bladder dysfunction, saddle numbness, constitutional symptoms. Did well with formal physical therapy one year ago. Desires to do home exercise program to start out with.  Obesity: Would like to get back on phentermine, 30 pound weight gain.  Past medical history:  Negative.  See flowsheet/record as well for more information.  Surgical history: Negative.  See flowsheet/record as well for more information.  Family history: Negative.  See flowsheet/record as well for more information.  Social history: Negative.  See flowsheet/record as well for more information.  Allergies, and medications have been entered into the medical record, reviewed, and no changes needed.   Review of Systems: No fevers, chills, night sweats, weight loss, chest pain, or shortness of breath.   Objective:    General: Well Developed, well nourished, and in no acute distress.  Neuro: Alert and oriented x3, extra-ocular muscles intact, sensation grossly intact.  HEENT: Normocephalic, atraumatic, pupils equal round reactive to light, neck supple, no masses, no lymphadenopathy, thyroid nonpalpable.  Skin: Warm and dry, no rashes. Cardiac: Regular rate and rhythm, no murmurs rubs or gallops, no lower extremity edema.  Respiratory: Clear to auscultation bilaterally. Not using accessory muscles, speaking in full sentences.  Procedure: Real-time Ultrasound Guided Injection of left knee Device: GE Logiq E  Verbal informed consent obtained.  Time-out conducted.  Noted no overlying erythema, induration, or other signs of local infection.    Skin prepped in a sterile fashion.  Local anesthesia: Topical Ethyl chloride.  With sterile technique and under real time ultrasound guidance:  1 mL Kenalog 40, 2 mL lidocaine, 2 mL bupivacaine injected easily Completed without difficulty  Pain immediately resolved suggesting accurate placement of the medication.  Advised to call if fevers/chills, erythema, induration, drainage, or persistent bleeding.  Images permanently stored and available for review in the ultrasound unit.  Impression: Technically successful ultrasound guided injection.  Procedure: Real-time Ultrasound Guided Injection of right knee Device: GE Logiq E  Verbal informed consent obtained.  Time-out conducted.  Noted no overlying erythema, induration, or other signs of local infection.  Skin prepped in a sterile fashion.  Local anesthesia: Topical Ethyl chloride.  With sterile technique and under real time ultrasound guidance:  1 mL Kenalog 40, 2 mL lidocaine, 2 mL bupivacaine injected easily Completed without difficulty  Pain immediately resolved suggesting accurate placement of the medication.  Advised to call if fevers/chills, erythema, induration, drainage, or persistent bleeding.  Images permanently stored and available for review in the ultrasound unit.  Impression: Technically successful ultrasound guided injection.  Impression and Recommendations:    Spondylosis of lumbar region without myelopathy or radiculopathy Patient will restart home rehabilitation exercises, he did well for one year ago with physical therapy. Pain is axial and discogenic today.  Primary osteoarthritis of both knees Repeat steroid injection, I'm also going to get him set up again for Orthovisc.  Obesity Restarting phentermine, return monthly for weight checks and refills.

## 2016-09-21 DIAGNOSIS — Z1159 Encounter for screening for other viral diseases: Secondary | ICD-10-CM | POA: Diagnosis not present

## 2016-09-26 DIAGNOSIS — H2513 Age-related nuclear cataract, bilateral: Secondary | ICD-10-CM | POA: Diagnosis not present

## 2016-09-26 DIAGNOSIS — H524 Presbyopia: Secondary | ICD-10-CM | POA: Diagnosis not present

## 2016-10-04 ENCOUNTER — Encounter: Payer: Self-pay | Admitting: Sports Medicine

## 2016-10-04 ENCOUNTER — Ambulatory Visit (INDEPENDENT_AMBULATORY_CARE_PROVIDER_SITE_OTHER): Payer: BLUE CROSS/BLUE SHIELD | Admitting: Sports Medicine

## 2016-10-04 DIAGNOSIS — E6609 Other obesity due to excess calories: Secondary | ICD-10-CM | POA: Diagnosis not present

## 2016-10-04 DIAGNOSIS — Z6830 Body mass index (BMI) 30.0-30.9, adult: Secondary | ICD-10-CM

## 2016-10-04 DIAGNOSIS — M170 Bilateral primary osteoarthritis of knee: Secondary | ICD-10-CM

## 2016-10-04 MED ORDER — PHENTERMINE HCL 37.5 MG PO TABS: ORAL_TABLET | ORAL | 0 refills | Status: DC

## 2016-10-04 NOTE — Progress Notes (Signed)
  Subjective:    CC: Follow-up  HPI: Obesity: Only 1 pound weight loss after the last visit.  Bilateral knee osteoarthritis: Still awaiting Orthovisc approval.  Past medical history:  Negative.  See flowsheet/record as well for more information.  Surgical history: Negative.  See flowsheet/record as well for more information.  Family history: Negative.  See flowsheet/record as well for more information.  Social history: Negative.  See flowsheet/record as well for more information.  Allergies, and medications have been entered into the medical record, reviewed, and no changes needed.   Review of Systems: No fevers, chills, night sweats, weight loss, chest pain, or shortness of breath.   Objective:    General: Well Developed, well nourished, and in no acute distress.  Neuro: Alert and oriented x3, extra-ocular muscles intact, sensation grossly intact.  HEENT: Normocephalic, atraumatic, pupils equal round reactive to light, neck supple, no masses, no lymphadenopathy, thyroid nonpalpable.  Skin: Warm and dry, no rashes. Cardiac: Regular rate and rhythm, no murmurs rubs or gallops, no lower extremity edema.  Respiratory: Clear to auscultation bilaterally. Not using accessory muscles, speaking in full sentences.  Impression and Recommendations:    Obesity Only 1 pound weight loss, entering the second month, patient agrees to lose 5 pounds by the next month or we will not be refilling phentermine.  Primary osteoarthritis of both knees Still awaiting approval on Orthovisc, seems to be covered for buy and bill but needs a PA.  I spent 25 minutes with this patient, greater than 50% was face-to-face time counseling regarding the above diagnoses

## 2016-10-04 NOTE — Assessment & Plan Note (Signed)
Still awaiting approval on Orthovisc, seems to be covered for buy and bill but needs a PA.

## 2016-10-04 NOTE — Assessment & Plan Note (Signed)
Only 1 pound weight loss, entering the second month, patient agrees to lose 5 pounds by the next month or we will not be refilling phentermine.

## 2016-10-25 MED ORDER — HYALURONAN 30 MG/2ML IX SOSY: 1.0000 | PREFILLED_SYRINGE | INTRA_ARTICULAR | 0 refills | Status: DC

## 2016-10-25 NOTE — Telephone Encounter (Signed)
Pt returned clinic call, Rx sent to pharmacy.

## 2016-10-25 NOTE — Addendum Note (Signed)
Addended by: Collie SiadICHARDSON, Antoinett Dorman M on: 10/25/2016 03:47 PM   Modules accepted: Orders

## 2016-10-25 NOTE — Telephone Encounter (Signed)
Received the following information from OV benefits investigation:   Orthovisc is covered. Buy and [facility] is required. Pharmacy procurement is not allowed. The deductible of $500 has been met. A copay of $30 applies. Once copay has been paid, insurance will pay 100% of the allowed amount. A prior authorization is required. Prior authorization can be initiated by calling [facility] at [phone]. Call Reference #'s : [id number].  Attempted to contact Pt to see if he would like to proceed, no answer. Left VM to Pt to return clinic call.

## 2016-11-06 ENCOUNTER — Ambulatory Visit (INDEPENDENT_AMBULATORY_CARE_PROVIDER_SITE_OTHER): Payer: BLUE CROSS/BLUE SHIELD | Admitting: Sports Medicine

## 2016-11-06 ENCOUNTER — Encounter: Payer: Self-pay | Admitting: Sports Medicine

## 2016-11-06 DIAGNOSIS — Z6830 Body mass index (BMI) 30.0-30.9, adult: Secondary | ICD-10-CM | POA: Diagnosis not present

## 2016-11-06 DIAGNOSIS — E6609 Other obesity due to excess calories: Secondary | ICD-10-CM

## 2016-11-06 MED ORDER — PHENTERMINE HCL 37.5 MG PO TABS: ORAL_TABLET | ORAL | 0 refills | Status: DC

## 2016-11-06 MED ORDER — DOCUSATE SODIUM 100 MG PO CAPS: 100.0000 mg | ORAL_CAPSULE | Freq: Two times a day (BID) | ORAL | 3 refills | Status: DC | PRN

## 2016-11-06 NOTE — Assessment & Plan Note (Signed)
With her weight loss after the second month of phentermine, only lost 1 pound the first month. Has become somewhat constipated and straining has created hernia, inguinal on the right. Adding Colace, refilling phentermine. Return in one month, if insufficient improvement we will refer to general surgery, he has had 2 hernias in the past

## 2016-11-06 NOTE — Progress Notes (Signed)
  Subjective:    CC: Follow-up  HPI: Obesity: 11 pound weight loss after the second month of phentermine, he has developed some pain in his right groin, feels consistent with previous hernias that he's had. On further questioning he does endorse some constipation and increased straining on the toilet.  Past medical history:  Negative.  See flowsheet/record as well for more information.  Surgical history: Negative.  See flowsheet/record as well for more information.  Family history: Negative.  See flowsheet/record as well for more information.  Social history: Negative.  See flowsheet/record as well for more information.  Allergies, and medications have been entered into the medical record, reviewed, and no changes needed.   Review of Systems: No fevers, chills, night sweats, weight loss, chest pain, or shortness of breath.   Objective:    General: Well Developed, well nourished, and in no acute distress.  Neuro: Alert and oriented x3, extra-ocular muscles intact, sensation grossly intact.  HEENT: Normocephalic, atraumatic, pupils equal round reactive to light, neck supple, no masses, no lymphadenopathy, thyroid nonpalpable.  Skin: Warm and dry, no rashes. Cardiac: Regular rate and rhythm, no murmurs rubs or gallops, no lower extremity edema.  Respiratory: Clear to auscultation bilaterally. Not using accessory muscles, speaking in full sentences. Right Hip: ROM IR: 60 Deg, ER: 60 Deg, Flexion: 120 Deg, Extension: 100 Deg, Abduction: 45 Deg, Adduction: 45 Deg Strength IR: 5/5, ER: 5/5, Flexion: 5/5, Extension: 5/5, Abduction: 5/5, Adduction: 5/5 Pelvic alignment unremarkable to inspection and palpation. Standing hip rotation and gait without trendelenburg / unsteadiness. Greater trochanter without tenderness to palpation. No tenderness over piriformis. No SI joint tenderness and normal minimal SI movement. Unable to reduce symptoms of hernia with hip exam, I am able to reproduce it  with simple palpation over the inguinal canal.  Impression and Recommendations:    Obesity With her weight loss after the second month of phentermine, only lost 1 pound the first month. Has become somewhat constipated and straining has created hernia, inguinal on the right. Adding Colace, refilling phentermine. Return in one month, if insufficient improvement we will refer to general surgery, he has had 2 hernias in the past

## 2016-11-27 DIAGNOSIS — M545 Low back pain: Secondary | ICD-10-CM | POA: Diagnosis not present

## 2016-12-04 ENCOUNTER — Ambulatory Visit (INDEPENDENT_AMBULATORY_CARE_PROVIDER_SITE_OTHER): Payer: BLUE CROSS/BLUE SHIELD | Admitting: Sports Medicine

## 2016-12-04 ENCOUNTER — Ambulatory Visit (INDEPENDENT_AMBULATORY_CARE_PROVIDER_SITE_OTHER): Payer: BLUE CROSS/BLUE SHIELD

## 2016-12-04 DIAGNOSIS — Z6830 Body mass index (BMI) 30.0-30.9, adult: Secondary | ICD-10-CM | POA: Diagnosis not present

## 2016-12-04 DIAGNOSIS — M25551 Pain in right hip: Secondary | ICD-10-CM | POA: Diagnosis not present

## 2016-12-04 DIAGNOSIS — E6609 Other obesity due to excess calories: Secondary | ICD-10-CM

## 2016-12-04 DIAGNOSIS — I10 Essential (primary) hypertension: Secondary | ICD-10-CM

## 2016-12-04 MED ORDER — PHENTERMINE HCL 37.5 MG PO TABS: ORAL_TABLET | ORAL | 0 refills | Status: DC

## 2016-12-04 MED ORDER — MELOXICAM 15 MG PO TABS: ORAL_TABLET | ORAL | 3 refills | Status: DC

## 2016-12-04 NOTE — Assessment & Plan Note (Signed)
Signs seem to be declaring themselves as a right hip flexor tendinitis versus iliopectineal bursitis. He did discuss this with Gen. surgery who simply suspect muscle strain rather than inguinal hernia. He did have a bit of pain with internal rotation and likely does have some right hip degenerative changes, also has gelling, most of his pain is elicited with resisted hip flexion. Switching from ibuprofen to meloxicam, hip x-rays, hip flexor rehabilitation exercises. If insufficient relief at the follow-up visit we will discuss x-ray versus MRI.

## 2016-12-04 NOTE — Progress Notes (Signed)
  Subjective:    CC: Follow-up  HPI: Obesity: Gained 3 pounds over the last week, endorses some difficulties with his son over the last week and some inconsistencies with eating. Would like one more month, promises to get down to 225 pounds by the next month or we can stop the medication.  Right hip pain: Initially was up in the groin, he has had several hernia repairs in the past, he did see Gen. surgery who suspected no hernia. His pain has presented itself further down over the anterior thigh now, with significant gelling and stiffness when sitting for a period of time. No paresthesias below the knee, no mechanical symptoms.  Past medical history:  Negative.  See flowsheet/record as well for more information.  Surgical history: Negative.  See flowsheet/record as well for more information.  Family history: Negative.  See flowsheet/record as well for more information.  Social history: Negative.  See flowsheet/record as well for more information.  Allergies, and medications have been entered into the medical record, reviewed, and no changes needed.   Review of Systems: No fevers, chills, night sweats, weight loss, chest pain, or shortness of breath.   Objective:    General: Well Developed, well nourished, and in no acute distress.  Neuro: Alert and oriented x3, extra-ocular muscles intact, sensation grossly intact.  HEENT: Normocephalic, atraumatic, pupils equal round reactive to light, neck supple, no masses, no lymphadenopathy, thyroid nonpalpable.  Skin: Warm and dry, no rashes. Cardiac: Regular rate and rhythm, no murmurs rubs or gallops, no lower extremity edema.  Respiratory: Clear to auscultation bilaterally. Not using accessory muscles, speaking in full sentences. Right hip: ROM IR: 45, today internal rotation does reproduce some of his pain, ER: 60 Deg, Flexion: 120 Deg, Extension: 100 Deg, Abduction: 45 Deg, Adduction: 45 Deg Strength IR: 5/5, ER: 5/5, Flexion: 5/5, resisted  hip flexion reproduces pain, Extension: 5/5, Abduction: 5/5, Adduction: 5/5 Pelvic alignment unremarkable to inspection and palpation. Standing hip rotation and gait without trendelenburg / unsteadiness. Greater trochanter without tenderness to palpation. No tenderness over piriformis. No SI joint tenderness and normal minimal SI movement.  Impression and Recommendations:    Obesity Did gain a few pounds but had a very difficult weekend, he agrees to get down to 225 pounds by next month. Entering the fourth month.  Right hip pain Signs seem to be declaring themselves as a right hip flexor tendinitis versus iliopectineal bursitis. He did discuss this with Gen. surgery who simply suspect muscle strain rather than inguinal hernia. He did have a bit of pain with internal rotation and likely does have some right hip degenerative changes, also has gelling, most of his pain is elicited with resisted hip flexion. Switching from ibuprofen to meloxicam, hip x-rays, hip flexor rehabilitation exercises. If insufficient relief at the follow-up visit we will discuss x-ray versus MRI.  Essential hypertension, benign Checking routine blood work

## 2016-12-04 NOTE — Assessment & Plan Note (Signed)
Did gain a few pounds but had a very difficult weekend, he agrees to get down to 225 pounds by next month. Entering the fourth month.

## 2016-12-04 NOTE — Assessment & Plan Note (Signed)
Checking routine bloodwork. 

## 2016-12-29 ENCOUNTER — Other Ambulatory Visit: Payer: Self-pay

## 2016-12-29 DIAGNOSIS — I10 Essential (primary) hypertension: Secondary | ICD-10-CM

## 2016-12-29 MED ORDER — IRBESARTAN-HYDROCHLOROTHIAZIDE 150-12.5 MG PO TABS: 1.0000 | ORAL_TABLET | Freq: Every day | ORAL | 3 refills | Status: DC

## 2017-01-08 ENCOUNTER — Ambulatory Visit: Payer: BLUE CROSS/BLUE SHIELD | Admitting: Sports Medicine

## 2017-03-20 DIAGNOSIS — M545 Low back pain: Secondary | ICD-10-CM | POA: Diagnosis not present

## 2017-04-20 ENCOUNTER — Ambulatory Visit (INDEPENDENT_AMBULATORY_CARE_PROVIDER_SITE_OTHER): Payer: BLUE CROSS/BLUE SHIELD | Admitting: Sports Medicine

## 2017-04-20 ENCOUNTER — Encounter: Payer: Self-pay | Admitting: Sports Medicine

## 2017-04-20 DIAGNOSIS — L509 Urticaria, unspecified: Secondary | ICD-10-CM | POA: Diagnosis not present

## 2017-04-20 DIAGNOSIS — L918 Other hypertrophic disorders of the skin: Secondary | ICD-10-CM

## 2017-04-20 DIAGNOSIS — I10 Essential (primary) hypertension: Secondary | ICD-10-CM

## 2017-04-20 MED ORDER — PREDNISONE 50 MG PO TABS: ORAL_TABLET | ORAL | 0 refills | Status: DC

## 2017-04-20 NOTE — Assessment & Plan Note (Signed)
Pressure is a little bit elevated, if still elevated at the follow-up visit in 2 weeks I am going to increase his Avalide.

## 2017-04-20 NOTE — Assessment & Plan Note (Signed)
Unable to identify any etiologic agent. Prednisone for 5 days, Benadryl at night, Claritin during the day.

## 2017-04-20 NOTE — Assessment & Plan Note (Signed)
Left flank, cryotherapy as above

## 2017-04-20 NOTE — Progress Notes (Signed)
  Subjective:    CC: Skin rash  HPI: Jeffrey MillinOliver is a pleasant 56 year old male, for the past few days he is noted increasing exquisitely pruritic rash over his head, back, elbows, knees.  He has not changed his lotions, detergents, creams, has had no new foods, no changes in his medications, no changes in the shape of his pills.  He does travel extensively.  Denies any shortness of breath, wheezing, no mucosal involvement, no GI symptoms.  Hypertension: Slightly elevated today, no headaches, visual changes, chest pain.  Skin tag: Noted on the left flank.  Would like this removed.  Mood disorder: High PHQ 9 score, he is taking care of several sick family members, he has been busy, traveling a lot, and a lot of stress during the holidays.  No suicidal or homicidal ideation, does not desire any active treatment for this yet.  Past medical history:  Negative.  See flowsheet/record as well for more information.  Surgical history: Negative.  See flowsheet/record as well for more information.  Family history: Negative.  See flowsheet/record as well for more information.  Social history: Negative.  See flowsheet/record as well for more information.  Allergies, and medications have been entered into the medical record, reviewed, and no changes needed.   (To billers/coders, pertinent past medical, social, surgical, family history can be found in problem list, if problem list is marked as reviewed then this indicates that past medical, social, surgical, family history was also reviewed)  Review of Systems: No fevers, chills, night sweats, weight loss, chest pain, or shortness of breath.   Objective:    General: Well Developed, well nourished, and in no acute distress.  Neuro: Alert and oriented x3, extra-ocular muscles intact, sensation grossly intact.  HEENT: Normocephalic, atraumatic, pupils equal round reactive to light, neck supple, no masses, no lymphadenopathy, thyroid nonpalpable.  Skin: Warm and  dry, hives present on the back, neck, arms.  There is also a large skin tag on the left flank. Cardiac: Regular rate and rhythm, no murmurs rubs or gallops, no lower extremity edema.  Respiratory: Clear to auscultation bilaterally. Not using accessory muscles, speaking in full sentences.  Procedure:  Cryodestruction of left flank skin tag Consent obtained and verified. Time-out conducted. Noted no overlying erythema, induration, or other signs of local infection. Completed without difficulty using Cryo-Gun. Advised to call if fevers/chills, erythema, induration, drainage, or persistent bleeding.  Impression and Recommendations:    Skin tag Left flank, cryotherapy as above  Hives Unable to identify any etiologic agent. Prednisone for 5 days, Benadryl at night, Claritin during the day.  Essential hypertension, benign Pressure is a little bit elevated, if still elevated at the follow-up visit in 2 weeks I am going to increase his Avalide. ___________________________________________ Ihor Austinhomas J. Benjamin Stainhekkekandam, M.D., ABFM., CAQSM. Primary Care and Sports Medicine [facility] [place]  Adjunct Instructor of Family Medicine  [employer]

## 2017-04-20 NOTE — Patient Instructions (Signed)
Hives  Hives (urticaria) are itchy, red, swollen areas on your skin. Hives can appear on any part of your body and can vary in size. They can be as small as the tip of a pen or much larger. Hives often fade within 24 hours (acute hives). In other cases, new hives appear after old ones fade. This cycle can continue for several days or weeks (chronic hives).  Hives result from your body's reaction to an irritant or to something that you are allergic to (trigger). When you are exposed to a trigger, your body releases a chemical (histamine) that causes redness, itching, and swelling. You can get hives immediately after being exposed to a trigger or hours later.  Hives do not spread from person to person (are not contagious). Your hives may get worse with scratching, exercise, and emotional stress.  What are the causes?  Causes of this condition include:   Allergies to certain foods or ingredients.   Insect bites or stings.   Exposure to pollen or pet dander.   Contact with latex or chemicals.   Spending time in sunlight, heat, or cold (exposure).   Exercise.   Stress.    You can also get hives from some medical conditions and treatments. These include:   Viruses, including the common cold.   Bacterial infections, such as urinary tract infections and strep throat.   Disorders such as vasculitis, lupus, or thyroid disease.   Certain medications.   Allergy shots.   Blood transfusions.    Sometimes, the cause of hives is not known (idiopathic hives).  What increases the risk?  This condition is more likely to develop in:   Women.   People who have food allergies, especially to citrus fruits, milk, eggs, peanuts, tree nuts, or shellfish.   People who are allergic to:  ? Medicines.  ? Latex.  ? Insects.  ? Animals.  ? Pollen.   People who have certain medical conditions, includinglupus or thyroid disease.    What are the signs or symptoms?  The main symptom of this condition is raised, itchyred or white  bumps or patches on your skin. These areas may:   Become large and swollen (welts).   Change in shape and location, quickly and repeatedly.   Be separate hives or connect over a large area of skin.   Sting or become painful.   Turn white when pressed in the center (blanch).    In severe cases, yourhands, feet, and face may also become swollen. This may occur if hives develop deeper in your skin.  How is this diagnosed?  This condition is diagnosed based on your symptoms, medical history, and physical exam. Your skin, urine, or blood may be tested to find out what is causing your hives and to rule out other health issues. Your health care provider may also remove a small sample of skin from the affected area and examine it under a microscope (biopsy).  How is this treated?  Treatment depends on the severity of your condition. Your health care provider may recommend using cool, wet cloths (cool compresses) or taking cool showers to relieve itching. Hives are sometimes treated with medicines, including:   Antihistamines.   Corticosteroids.   Antibiotics.   An injectable medicine (omalizumab). Your health care provider may prescribe this if you have chronic idiopathic hives and you continue to have symptoms even after treatment with antihistamines.    Severe cases may require an emergency injection of adrenaline (epinephrine) to prevent a   life-threatening allergic reaction (anaphylaxis).  Follow these instructions at home:  Medicines   Take or apply over-the-counter and prescription medicines only as told by your health care provider.   If you were prescribed an antibiotic medicine, use it as told by your health care provider. Do not stop taking the antibiotic even if you start to feel better.  Skin Care   Apply cool compresses to the affected areas.   Do not scratch or rub your skin.  General instructions   Do not take hot showers or baths. This can make itching worse.   Do not wear tight-fitting  clothing.   Use sunscreen and wear protective clothing when you are outside.   Avoid any substances that cause your hives. Keep a journal to help you track what causes your hives. Write down:  ? What medicines you take.  ? What you eat and drink.  ? What products you use on your skin.   Keep all follow-up visits as told by your health care provider. This is important.  Contact a health care provider if:   Your symptoms are not controlled with medicine.   Your joints are painful or swollen.  Get help right away if:   You have a fever.   You have pain in your abdomen.   Your tongue or lips are swollen.   Your eyelids are swollen.   Your chest or throat feels tight.   You have trouble breathing or swallowing.  These symptoms may represent a serious problem that is an emergency. Do not wait to see if the symptoms will go away. Get medical help right away. Call your local emergency services (911 in the [place] Do not drive yourself to the hospital.  This information is not intended to replace advice given to you by your health care provider. Make sure you discuss any questions you have with your health care provider.  Document Released: 04/17/2005 Document Revised: 09/15/2015 Document Reviewed: 02/03/2015  Elsevier Interactive Patient Education  2018 [employer]

## 2017-04-26 ENCOUNTER — Encounter: Payer: Self-pay | Admitting: Osteopathic Medicine

## 2017-04-26 ENCOUNTER — Ambulatory Visit: Payer: BLUE CROSS/BLUE SHIELD | Admitting: Osteopathic Medicine

## 2017-04-26 VITALS — BP 138/85 | HR 73 | Temp 97.9°F | Wt 255.4 lb

## 2017-04-26 DIAGNOSIS — L509 Urticaria, unspecified: Secondary | ICD-10-CM | POA: Diagnosis not present

## 2017-04-26 DIAGNOSIS — R51 Headache: Secondary | ICD-10-CM | POA: Diagnosis not present

## 2017-04-26 MED ORDER — METHYLPREDNISOLONE SODIUM SUCC 125 MG IJ SOLR
125.0000 mg | Freq: Once | INTRAMUSCULAR | Status: AC
  Administered 2017-04-26: 125 mg via INTRAMUSCULAR

## 2017-04-26 MED ORDER — PREDNISONE 10 MG (48) PO TBPK: ORAL_TABLET | Freq: Every day | ORAL | 0 refills | Status: DC

## 2017-04-26 MED ORDER — BETAMETHASONE DIPROPIONATE 0.05 % EX CREA: TOPICAL_CREAM | Freq: Two times a day (BID) | CUTANEOUS | 0 refills | Status: DC

## 2017-04-26 NOTE — Patient Instructions (Addendum)
Plan:  Shot of steroids today  Steroid cream sent to apply directly to itching areas of skin (caution: prolonged use can whiten/thin the skin, use up to 2 weeks)  Start another round of oral steroids tomorrow  If rash still bad or worse next week, come back to the office for biopsy - Dr Denyse Amassorey or Vinetta Bergamoharley should have openings   Follow up as directed with Dr. Karie Schwalbe. For routine care

## 2017-04-26 NOTE — Progress Notes (Signed)
HPI: Jeffrey Burnett is a 56 y.o. male who  has a past medical history of Hypertension and Sleep apnea.  [name] today, 04/26/17,  for chief complaint of:  Chief Complaint  Patient presents with  . Follow-up rash - worse     HPI:   Patient states rash is no better and in fact maybe a little bit worse in severity. Reports itching rash on head, back, elbows, knees.  Last seen by Dr T 04/20/17 six days ago, he was noted increasing pruritic rash over his head, back, elbows, knees.  He had not changed his lotions, detergents, creams, has had no new foods, no changes in his medications, no changes in the shape of his pills.  He had been traveling extensively.  Denied any shortness of breath, wheezing, no mucosal involvement, no GI symptoms. Dr T started Prednisone and Benadryl qhs, Claritin in the daytime.     Past medical, surgical, social and family history reviewed:  Patient Active Problem List   Diagnosis Date Noted  . Skin tag 04/20/2017  . Hives 04/20/2017  . Right hip pain 12/04/2016  . Tension type headache 11/17/2015  . Mitral regurgitation 10/26/2015  . Annual physical exam 10/19/2015  . Presbycusis 09/06/2015  . Primary osteoarthritis of both knees 08/19/2015  . 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 Essential hypertension, benign 08/19/2015  . Prediabetes 08/19/2015  . Obesity 08/19/2015    Past Surgical History:  Procedure Laterality Date  . HERNIA REPAIR     hiatal hernia    Social History   Tobacco Use  . Smoking status: Current Every Day Smoker    Types: Cigars  . Smokeless tobacco: Never Used  Substance Use Topics  . Alcohol use: Yes    Alcohol/week: 1.2 - 1.8 oz    Types: 2 - 3 Cans of beer per week    Family History  Problem Relation Age of Onset  . Hypertension Mother   . Hypertension Sister   . Diabetes Sister   . Hypertension Brother   . Diabetes  Maternal Aunt      Current medication list and allergy/intolerance information reviewed:    Current Outpatient Medications  Medication Sig Dispense Refill  . irbesartan-hydrochlorothiazide (AVALIDE) 150-12.5 MG tablet Take 1 tablet by mouth daily. 90 tablet 3   No current facility-administered medications for this visit.     No Known Allergies    Review of Systems:  Constitutional:  No  fever, no chills  HEENT: No  headache, no vision change,   Cardiac: No  chest pain  Respiratory:  No  shortness of breath. No  Cough  Gastrointestinal: No  abdominal pain, No  nausea  Musculoskeletal: No new myalgia/arthralgia  Skin: +Rash, No other wounds/concerning lesions   Hem/Onc: No  easy bruising/bleeding, No  abnormal lymph node  Neurologic: No  weakness, No  dizziness  Exam:  BP 138/85   Pulse 73   Temp 97.9 F (36.6 C) (Oral)   Wt 255 lb 6.4 oz (115.8 kg)   BMI 35.62 kg/m   General: Well Developed, well nourished, and in no acute distress.  Neuro: Alert and oriented x3 HEENT: Normocephalic, atraumatic Skin: Warm and dry, hives present on the back, neck, arms.   Cardiac: Regular rate and rhythm, no murmurs rubs or gallops, no lower extremity edema.  Respiratory: Clear to auscultation bilaterally. Not using accessory muscles, speaking in full sentences.  ASSESSMENT/PLAN: The encounter diagnosis was Hives.  Trial escalation of steroid therapy for severe hives, unknown exposure/irritant. If persisting despite this, may consider biopsy in the office. Advised continue Benadryl daily at bedtime, Claritin during the day    Meds ordered this encounter  Medications  . predniSONE (STERAPRED UNI-PAK 48 TAB) 10 MG (48) TBPK tablet    Sig: Take by mouth daily. 12-Day taper, po. To start 04/27/17    Dispense:  48 tablet    Refill:  0  . betamethasone dipropionate (DIPROLENE) 0.05 % cream    Sig: Apply topically 2 (two) times daily. To affected area(s) as needed for  itching, up to 2 weeks    Dispense:  45 g    Refill:  0  . methylPREDNISolone sodium succinate (SOLU-MEDROL) 125 mg/2 mL injection 125 mg       Patient Instructions  Plan:  Shot of steroids today  Steroid cream sent to apply directly to itching areas of skin (caution: prolonged use can whiten/thin the skin, use up to 2 weeks)  Start another round of oral steroids tomorrow  If rash still bad or worse next week, come back to the office for biopsy - Dr Denyse Amassorey or Vinetta Bergamoharley should have openings   Follow up as directed with Dr. Karie Schwalbe. For routine care     Visit summary with medication list and pertinent instructions was printed for patient to review. All questions at time of visit were answered - patient instructed to contact office with any additional concerns. ER/RTC precautions were reviewed with the patient.   Follow-up plan: Return if symptoms worsen or fail to improve.    Please note: voice recognition software was used to produce this document, and typos may escape review. Please contact Dr. Lyn HollingsheadAlexander for any needed clarifications.

## 2017-04-30 DIAGNOSIS — L508 Other urticaria: Secondary | ICD-10-CM | POA: Diagnosis not present

## 2017-04-30 DIAGNOSIS — L280 Lichen simplex chronicus: Secondary | ICD-10-CM | POA: Diagnosis not present

## 2017-04-30 DIAGNOSIS — L731 Pseudofolliculitis barbae: Secondary | ICD-10-CM | POA: Diagnosis not present

## 2017-05-07 ENCOUNTER — Telehealth: Payer: Self-pay

## 2017-05-07 NOTE — Telephone Encounter (Signed)
Pt wife called pt still has hives and pruritis and pt is out of medications. Would like to know if he can get something for it until his appointment on Thursday. Please advise.

## 2017-05-08 MED ORDER — HYDROXYZINE HCL 50 MG PO TABS: 50.0000 mg | ORAL_TABLET | Freq: Three times a day (TID) | ORAL | 3 refills | Status: DC | PRN

## 2017-05-08 NOTE — Telephone Encounter (Signed)
Adding hydroxyzine, also time to get some labs.

## 2017-05-09 NOTE — Telephone Encounter (Signed)
Pt.notified

## 2017-05-10 ENCOUNTER — Encounter: Payer: Self-pay | Admitting: Sports Medicine

## 2017-05-10 ENCOUNTER — Ambulatory Visit (INDEPENDENT_AMBULATORY_CARE_PROVIDER_SITE_OTHER): Payer: BLUE CROSS/BLUE SHIELD | Admitting: Sports Medicine

## 2017-05-10 DIAGNOSIS — L918 Other hypertrophic disorders of the skin: Secondary | ICD-10-CM | POA: Diagnosis not present

## 2017-05-10 DIAGNOSIS — E781 Pure hyperglyceridemia: Secondary | ICD-10-CM

## 2017-05-10 DIAGNOSIS — I10 Essential (primary) hypertension: Secondary | ICD-10-CM | POA: Diagnosis not present

## 2017-05-10 DIAGNOSIS — L509 Urticaria, unspecified: Secondary | ICD-10-CM | POA: Diagnosis not present

## 2017-05-10 MED ORDER — FLUCONAZOLE 200 MG PO TABS: 200.0000 mg | ORAL_TABLET | ORAL | 0 refills | Status: DC

## 2017-05-10 NOTE — Progress Notes (Addendum)
Subjective:    CC: Recheck skin rash  HPI: This is a pleasant 57 year old male, for 20 days now he has had a rash on his low back, highly pruritic, we were unable to identify any triggers, he is done 2 courses of prednisone, hydroxyzine, topical steroids without any improvement.  In addition he still has an open wound where we did cryotherapy of his skin tag, but does tend to bleed regularly.  I reviewed the past medical history, family history, social history, surgical history, and allergies today and no changes were needed.  Please see the problem list section below in epic for further details.  Past Medical History: Past Medical History:  Diagnosis Date  . Hypertension   . Sleep apnea    Past Surgical History: Past Surgical History:  Procedure Laterality Date  . HERNIA REPAIR     hiatal hernia   Social History: Social History   Socioeconomic History  . Marital status: Married    Spouse name: None  . Number of children: None  . Years of education: None  . Highest education level: None  Social Needs  . Financial resource strain: None  . Food insecurity - worry: None  . Food insecurity - inability: None  . Transportation needs - medical: None  . Transportation needs - non-medical: None  Occupational History  . None  Tobacco Use  . Smoking status: Current Every Day Smoker    Types: Cigars  . Smokeless tobacco: Never Used  Substance and Sexual Activity  . Alcohol use: Yes    Alcohol/week: 1.2 - 1.8 oz    Types: 2 - 3 Cans of beer per week  . Drug use: None  . Sexual activity: None  Other Topics Concern  . None  Social History Narrative  . None   Family History: Family History  Problem Relation Age of Onset  . Hypertension Mother   . Hypertension Sister   . Diabetes Sister   . Hypertension Brother   . Diabetes Maternal Aunt    Allergies: No Known Allergies Medications: See med rec.  Review of Systems: No fevers, chills, night sweats, weight loss,  chest pain, or shortness of breath.   Objective:    General: Well Developed, well nourished, and in no acute distress.  Neuro: Alert and oriented x3, extra-ocular muscles intact, sensation grossly intact.  HEENT: Normocephalic, atraumatic, pupils equal round reactive to light, neck supple, no masses, no lymphadenopathy, thyroid nonpalpable.  Skin: Warm and dry, hyperpigmented, raised, excoriated rash on the low back in a Christmas tree distribution. Cardiac: Regular rate and rhythm, no murmurs rubs or gallops, no lower extremity edema.  Respiratory: Clear to auscultation bilaterally. Not using accessory muscles, speaking in full sentences. Abdomen: Left flank does show a fairly raw appearing junk of granulation tissue approximately 1 cm across, actively bleeding.  Impression and Recommendations:    Hives At this point he has failed 2 courses of prednisone, hydroxyzine. The rash does appear to be declaring itself to be tinea versicolor, it is not somewhat of a Christmas tree distribution of hyperpigmentation on his back. Adding high-dose fluconazole. Return in 4 weeks before considering biopsy.  Skin tag Still appears somewhat raw with overlying granulation tissue and persistent bleeding. This is 20 days after the procedure. I aggressively hyfrecated it to achieve hemostasis. He will simply cover with a Band-Aid and avoid dislodging the scab.  Hypertriglyceridemia Adding Lovaza, recheck in 2-3 months  I spent 25 minutes with this patient, greater than 50% was face-to-face  time counseling regarding the above diagnoses ___________________________________________ Ihor Austin. Benjamin Stain, M.D., ABFM., CAQSM. Primary Care and Sports Medicine [facility]  Adjunct Instructor of Family Medicine  [employer]

## 2017-05-10 NOTE — Assessment & Plan Note (Signed)
Still appears somewhat raw with overlying granulation tissue and persistent bleeding. This is 20 days after the procedure. I aggressively hyfrecated it to achieve hemostasis. He will simply cover with a Band-Aid and avoid dislodging the scab.

## 2017-05-10 NOTE — Patient Instructions (Signed)
Tinea Versicolor Tinea versicolor is a common fungal infection of the skin. It causes a rash that appears as light or dark patches on the skin. The rash most often occurs on the chest, back, neck, or upper arms. This condition is more common during warm weather. Other than affecting how your skin looks, tinea versicolor usually does not cause other problems. In most cases, the infection goes away in a few weeks with treatment. It may take a few months for the patches on your skin to clear up. What are the causes? Tinea versicolor occurs when a type of fungus that is normally present on the skin starts to overgrow. This fungus is a kind of yeast. The exact cause of the overgrowth is not known. This condition cannot be passed from one person to another (noncontagious). What increases the risk? This condition is more likely to develop when certain factors are present, such as:  Heat and humidity.  Sweating too much.  Hormone changes.  Oily skin.  A weak defense (immune) system.  What are the signs or symptoms? Symptoms of this condition may include:  A rash on your skin that is made up of light or dark patches. The rash may have: ? Patches of tan or pink spots on light skin. ? Patches of white or brown spots on dark skin. ? Patches of skin that do not tan. ? Well-marked edges. ? Scales on the discolored areas.  Mild itching.  How is this diagnosed? A health care provider can usually diagnose this condition by looking at your skin. During the exam, he or she may use ultraviolet light to help determine the extent of the infection. In some cases, a skin sample may be taken by scraping the rash. This sample will be viewed under a microscope to check for yeast overgrowth. How is this treated? Treatment for this condition may include:  Dandruff shampoo that is applied to the affected skin during showers or bathing.  Over-the-counter medicated skin cream, lotion, or soaps.  Prescription  antifungal medicine in the form of skin cream or pills.  Medicine to help reduce itching.  Follow these instructions at home:  Take medicines only as directed by your health care provider.  Apply dandruff shampoo to the affected area if told to do so by your health care provider. You may be instructed to scrub the affected skin for several minutes each day.  Do not scratch the affected area of skin.  Avoid hot and humid conditions.  Do not use tanning booths.  Try to avoid sweating a lot. Contact a health care provider if:  Your symptoms get worse.  You have a fever.  You have redness, swelling, or pain at the site of your rash.  You have fluid, blood, or pus coming from your rash.  Your rash returns after treatment. This information is not intended to replace advice given to you by your health care provider. Make sure you discuss any questions you have with your health care provider. Document Released: 04/14/2000 Document Revised: 12/19/2015 Document Reviewed: 01/27/2014 Elsevier Interactive Patient Education  2018 Elsevier Inc.  

## 2017-05-10 NOTE — Assessment & Plan Note (Signed)
At this point he has failed 2 courses of prednisone, hydroxyzine. The rash does appear to be declaring itself to be tinea versicolor, it is not somewhat of a Christmas tree distribution of hyperpigmentation on his back. Adding high-dose fluconazole. Return in 4 weeks before considering biopsy.

## 2017-05-11 DIAGNOSIS — E781 Pure hyperglyceridemia: Secondary | ICD-10-CM | POA: Insufficient documentation

## 2017-05-11 LAB — COMPLETE METABOLIC PANEL WITHOUT GFR
AG Ratio: 1.3 (calc) (ref 1.0–2.5)
ALT: 37 U/L (ref 9–46)
Alkaline phosphatase (APISO): 49 U/L (ref 40–115)
Chloride: 102 mmol/L (ref 98–110)
GFR, Est African American: 85 mL/min/1.73m2 (ref >=60)
Potassium: 4.2 mmol/L (ref 3.5–5.3)
Total Bilirubin: 0.4 mg/dL (ref 0.2–1.2)
Total Protein: 6.5 g/dL (ref 6.1–8.1)

## 2017-05-11 LAB — CBC
HCT: 39 % (ref 38.5–50.0)
Hemoglobin: 13 g/dL — ABNORMAL LOW (ref 13.2–17.1)
MCH: 29.1 pg (ref 27.0–33.0)
MCHC: 33.3 g/dL (ref 32.0–36.0)
MCV: 87.4 fL (ref 80.0–100.0)
MPV: 10.3 fL (ref 7.5–12.5)
Platelets: 224 Thousand/uL (ref 140–400)
RBC: 4.46 Million/uL (ref 4.20–5.80)
RDW: 12.6 % (ref 11.0–15.0)
WBC: 13.2 10*3/uL — ABNORMAL HIGH (ref 3.8–10.8)

## 2017-05-11 LAB — LIPID PANEL W/REFLEX DIRECT LDL
Cholesterol: 223 mg/dL — ABNORMAL HIGH (ref <200)
HDL: 102 mg/dL (ref >40)
LDL Cholesterol (Calc): 94 mg/dL
Non-HDL Cholesterol (Calc): 121 mg/dL (ref <130)
Total CHOL/HDL Ratio: 2.2 (calc) (ref <5.0)
Triglycerides: 175 mg/dL — ABNORMAL HIGH (ref <150)

## 2017-05-11 LAB — COMPLETE METABOLIC PANEL WITH GFR
AST: 19 U/L (ref 10–35)
Albumin: 3.7 g/dL (ref 3.6–5.1)
BUN: 17 mg/dL (ref 7–25)
CO2: 29 mmol/L (ref 20–32)
Calcium: 8.7 mg/dL (ref 8.6–10.3)
Creat: 1.12 mg/dL (ref 0.70–1.33)
GFR, Est Non African American: 73 mL/min/{1.73_m2} (ref >=60)
Globulin: 2.8 g/dL (calc) (ref 1.9–3.7)
Glucose, Bld: 89 mg/dL (ref 65–99)
Sodium: 139 mmol/L (ref 135–146)

## 2017-05-11 LAB — HEPATITIS C ANTIBODY
Hepatitis C Ab: NONREACTIVE
SIGNAL TO CUT-OFF: 0.01 (ref <1.00)

## 2017-05-11 LAB — HIV ANTIBODY (ROUTINE TESTING W REFLEX): HIV 1&2 Ab, 4th Generation: NONREACTIVE

## 2017-05-11 LAB — HEMOGLOBIN A1C
Hgb A1c MFr Bld: 6.4 %{Hb} — ABNORMAL HIGH (ref <5.7)
Mean Plasma Glucose: 137 (calc)
eAG (mmol/L): 7.6 (calc)

## 2017-05-11 LAB — VITAMIN D 25 HYDROXY (VIT D DEFICIENCY, FRACTURES): Vit D, 25-Hydroxy: 9 ng/mL — ABNORMAL LOW (ref 30–100)

## 2017-05-11 LAB — TSH: TSH: 2.82 m[IU]/L (ref 0.40–4.50)

## 2017-05-11 MED ORDER — OMEGA-3-ACID ETHYL ESTERS 1 G PO CAPS: 2.0000 g | ORAL_CAPSULE | Freq: Two times a day (BID) | ORAL | 3 refills | Status: DC

## 2017-05-11 MED ORDER — VITAMIN D (ERGOCALCIFEROL) 1.25 MG (50000 UNIT) PO CAPS: 50000.0000 [IU] | ORAL_CAPSULE | ORAL | 0 refills | Status: DC

## 2017-05-11 NOTE — Assessment & Plan Note (Signed)
Adding Lovaza, recheck in 2-3 months

## 2017-05-11 NOTE — Addendum Note (Signed)
Addended by: Monica BectonHEKKEKANDAM, Khoury Siemon J on: 05/11/2017 11:55 AM   Modules accepted: Orders

## 2017-05-15 ENCOUNTER — Ambulatory Visit: Payer: BLUE CROSS/BLUE SHIELD | Admitting: Physician Assistant

## 2017-05-15 ENCOUNTER — Encounter: Payer: Self-pay | Admitting: Physician Assistant

## 2017-05-15 VITALS — BP 143/84 | HR 98 | Temp 98.1°F | Wt 265.0 lb

## 2017-05-15 DIAGNOSIS — L299 Pruritus, unspecified: Secondary | ICD-10-CM | POA: Diagnosis not present

## 2017-05-15 DIAGNOSIS — R21 Rash and other nonspecific skin eruption: Secondary | ICD-10-CM | POA: Diagnosis not present

## 2017-05-15 MED ORDER — BETAMETHASONE DIPROPIONATE 0.05 % EX CREA: TOPICAL_CREAM | Freq: Two times a day (BID) | CUTANEOUS | 0 refills | Status: DC

## 2017-05-15 MED ORDER — FEXOFENADINE HCL 180 MG PO TABS: 180.0000 mg | ORAL_TABLET | Freq: Every day | ORAL | 3 refills | Status: DC

## 2017-05-15 NOTE — Patient Instructions (Addendum)
For itching:  - Allegra/Fexofenadine 180 mg daily - Hydroxyzine 50-75mg  at bedtime - Moisturize skin - Use a non-soap cleanser in the shower such as a Cetaphil - Apply moisturizing, unscented cream every 3 hours - Apply steroid cream to the hot spots. Wait 15 minutes, then apply barrier like Aquafor/Vaseline

## 2017-05-15 NOTE — Progress Notes (Signed)
HPI:                                                                Jeffrey PrimeOliver Burnett is a 57 y.o. male who [facility] today for rash  Pruritic, disseminated rash present for 2 months. Rash began on his trunk and has spread to his entire body with the exception of his face and genitals. Reports steroid creams and oral steroids initially provided temporary relief, but then rash returned worse than before. Hydroxyzine does not help with itching. He was most recently seen by his PCP and started on high-dose Fluconazole x 4 weeks. He has had 1 antifungal treatment to date.  No flowsheet data found.    Past Medical History:  Diagnosis Date  . Hypertension   . Sleep apnea    Past Surgical History:  Procedure Laterality Date  . HERNIA REPAIR     hiatal hernia   Social History   Tobacco Use  . Smoking status: Current Every Day Smoker    Types: Cigars  . Smokeless tobacco: Never Used  Substance Use Topics  . Alcohol use: Yes    Alcohol/week: 1.2 - 1.8 oz    Types: 2 - 3 Cans of beer per week   family history includes Diabetes in his maternal aunt and sister; Hypertension in his brother, mother, and sister.    ROS: negative except as noted in the HPI  Medications: Current Outpatient Medications  Medication Sig Dispense Refill  . betamethasone dipropionate (DIPROLENE) 0.05 % cream Apply topically 2 (two) times daily. To affected area(s) as needed for itching, up to 2 weeks 45 g 0  . fluconazole (DIFLUCAN) 200 MG tablet Take 1 tablet (200 mg total) by mouth once a week. 4 tablet 0  . hydrOXYzine (ATARAX/VISTARIL) 50 MG tablet Take 1 tablet (50 mg total) by mouth 3 (three) times daily as needed for itching. 30 tablet 3  . irbesartan-hydrochlorothiazide (AVALIDE) 150-12.5 MG tablet Take 1 tablet by mouth daily. 90 tablet 3  . omega-3 acid ethyl esters (LOVAZA) 1 g capsule Take 2 capsules (2 g total) by mouth 2 (two) times  daily. 360 capsule 3  . Vitamin D, Ergocalciferol, (DRISDOL) 50000 units CAPS capsule Take 1 capsule (50,000 Units total) by mouth every 7 (seven) days. Take for 8 total doses(weeks) 8 capsule 0   No current facility-administered medications for this visit.    No Known Allergies     Objective:  BP (!) 143/84   Pulse 98   Temp 98.1 F (36.7 C) (Oral)   Wt 265 lb (120.2 kg)   SpO2 95%   BMI 36.96 kg/m  Gen:  alert, not ill-appearing, no distress, appropriate for age HEENT: head normocephalic without obvious abnormality, conjunctiva and cornea clear, trachea midline Pulm: Normal work of breathing, normal phonation Neuro: alert and oriented x 3, no tremor MSK: extremities atraumatic, normal gait and station Skin: left flank with approximately 1 cm area of raw, ulcerated lesion, no drainage or bleeding, no surrounding erythema or induration; hyperpigmented, leathery rash in Christmas tree distribution on the posterior trunk; 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 No results  found.    Assessment and Plan: 57 y.o. male with   1. Rash and nonspecific skin eruption - Ambulatory referral to Dermatology - continue Fluconazole - adding Allegra for day-time pruritis - Hydroxyzine 50-75mg  at bedtime - Moisturize skin - Use a non-soap cleanser in the shower such as a Cetaphil - Apply moisturizing, unscented cream every 3 hours - Apply steroid cream to the hot spots. Wait 15 minutes, then apply barrier like Aquafor/Vaseline - fexofenadine (ALLEGRA) 180 MG tablet; Take 1 tablet (180 mg total) by mouth daily.  Dispense: 90 tablet; Refill: 3  2. Pruritus of skin - secondary to rash. Normal LFT's this month. - fexofenadine (ALLEGRA) 180 MG tablet; Take 1 tablet (180 mg total) by mouth daily.  Dispense: 90 tablet; Refill: 3 Lab Results  Component Value Date   ALT 37 05/10/2017   AST 19 05/10/2017    ALKPHOS 61 10/19/2015   BILITOT 0.4 05/10/2017       Patient education and anticipatory guidance given Patient agrees with treatment plan Follow-up as needed if symptoms worsen or fail to improve  Levonne Hubert PA-C

## 2017-05-20 ENCOUNTER — Encounter: Payer: Self-pay | Admitting: Physician Assistant

## 2017-05-20 DIAGNOSIS — R21 Rash and other nonspecific skin eruption: Secondary | ICD-10-CM | POA: Insufficient documentation

## 2017-05-23 ENCOUNTER — Encounter: Payer: Self-pay | Admitting: Sports Medicine

## 2017-05-23 ENCOUNTER — Ambulatory Visit (INDEPENDENT_AMBULATORY_CARE_PROVIDER_SITE_OTHER): Payer: BLUE CROSS/BLUE SHIELD | Admitting: Sports Medicine

## 2017-05-23 DIAGNOSIS — L509 Urticaria, unspecified: Secondary | ICD-10-CM | POA: Diagnosis not present

## 2017-05-23 DIAGNOSIS — L918 Other hypertrophic disorders of the skin: Secondary | ICD-10-CM

## 2017-05-23 NOTE — Assessment & Plan Note (Signed)
Doing significantly better after hyfrecation.  Healing, no further intervention needed.

## 2017-05-23 NOTE — Progress Notes (Signed)
Subjective:    CC: Follow-up  HPI: This is a pleasant 57 year old male, we have been treating for nonspecific rash for several weeks now, severe pruritus.  He is failed several courses of prednisone, hydroxyzine, at the last visit with me the rash had taken a Christmas tree appearance across his back, with hyperpigmented patches suspicious for tinea versicolor.  I added high-dose fluconazole for 4 weeks and he returns today having fantastic relief.  Still has a few symptoms but still has 2 more weeks of high-dose fluconazole.  Skin tag: Hyfrecator at the last visit for hemostasis after a long period of slow bleeding.  Doing much better today.  I reviewed the past medical history, family history, social history, surgical history, and allergies today and no changes were needed.  Please see the problem list section below in epic for further details.  Past Medical History: Past Medical History:  Diagnosis Date  . Hypertension   . Sleep apnea    Past Surgical History: Past Surgical History:  Procedure Laterality Date  . HERNIA REPAIR     hiatal hernia   Social History: Social History   Socioeconomic History  . Marital status: Married    Spouse name: None  . Number of children: None  . Years of education: None  . Highest education level: None  Social Needs  . Financial resource strain: None  . Food insecurity - worry: None  . Food insecurity - inability: None  . Transportation needs - medical: None  . Transportation needs - non-medical: None  Occupational History  . None  Tobacco Use  . Smoking status: Current Every Day Smoker    Types: Cigars  . Smokeless tobacco: Never Used  Substance and Sexual Activity  . Alcohol use: Yes    Alcohol/week: 1.2 - 1.8 oz    Types: 2 - 3 Cans of beer per week  . Drug use: None  . Sexual activity: None  Other Topics Concern  . None  Social History Narrative  . None   Family History: Family History  Problem Relation Age of Onset  .  Hypertension Mother   . Hypertension Sister   . Diabetes Sister   . Hypertension Brother   . Diabetes Maternal Aunt    Allergies: No Known Allergies Medications: See med rec.  Review of Systems: No fevers, chills, night sweats, weight loss, chest pain, or shortness of breath.   Objective:    General: Well Developed, well nourished, and in no acute distress.  Neuro: Alert and oriented x3, extra-ocular muscles intact, sensation grossly intact.  HEENT: Normocephalic, atraumatic, pupils equal round reactive to light, neck supple, no masses, no lymphadenopathy, thyroid nonpalpable.  Skin: Warm and dry, rash is improved considerably.  Skin tag is now flat, granulation tissue is present and it is also epithelialized. Cardiac: Regular rate and rhythm, no murmurs rubs or gallops, no lower extremity edema.  Respiratory: Clear to auscultation bilaterally. Not using accessory muscles, speaking in full sentences.  Impression and Recommendations:    Skin tag Doing significantly better after hyfrecation.  Healing, no further intervention needed.  Hives Finally improving now after the addition of high-dose fluconazole.  He does have another couple of weeks of fluconazole. He is failed 2 courses of prednisone, high-dose hydroxyzine. The rash for the most part declared itself is tinea versicolor at the last visit. ___________________________________________ Ihor Austinhomas J. Benjamin Stainhekkekandam, M.D., ABFM., CAQSM. Primary Care and Sports Medicine [facility]  Adjunct Instructor of Family Medicine  [employer]

## 2017-05-23 NOTE — Assessment & Plan Note (Signed)
Finally improving now after the addition of high-dose fluconazole.  He does have another couple of weeks of fluconazole. He is failed 2 courses of prednisone, high-dose hydroxyzine. The rash for the most part declared itself is tinea versicolor at the last visit.

## 2017-06-07 ENCOUNTER — Encounter: Payer: Self-pay | Admitting: Sports Medicine

## 2017-06-07 ENCOUNTER — Ambulatory Visit (INDEPENDENT_AMBULATORY_CARE_PROVIDER_SITE_OTHER): Payer: BLUE CROSS/BLUE SHIELD | Admitting: Sports Medicine

## 2017-06-07 VITALS — BP 129/82 | HR 106 | Ht 71.0 in | Wt 266.0 lb

## 2017-06-07 DIAGNOSIS — Z23 Encounter for immunization: Secondary | ICD-10-CM

## 2017-06-07 DIAGNOSIS — Z0000 Encounter for general adult medical examination without abnormal findings: Secondary | ICD-10-CM | POA: Diagnosis not present

## 2017-06-07 DIAGNOSIS — L509 Urticaria, unspecified: Secondary | ICD-10-CM | POA: Diagnosis not present

## 2017-06-07 NOTE — Addendum Note (Signed)
Addended by: Baird KayUGLAS, Jamori Biggar M on: 06/07/2017 09:03 AM   Modules accepted: Orders

## 2017-06-07 NOTE — Assessment & Plan Note (Signed)
Essentially resolved now with high-dose Diflucan, return as needed.

## 2017-06-07 NOTE — Assessment & Plan Note (Signed)
Tdap today, return in a year for a physical.

## 2017-06-07 NOTE — Progress Notes (Signed)
  Subjective:    CC: Follow-up  HPI: Skin rash: With hives, intense pruritus, resolved finally after several courses of prednisone and topical agents, after the addition of high-dose Diflucan suggesting that this was more of a tinea versicolor.  I reviewed the past medical history, family history, social history, surgical history, and allergies today and no changes were needed.  Please see the problem list section below in epic for further details.  Past Medical History: Past Medical History:  Diagnosis Date  . Hypertension   . Sleep apnea    Past Surgical History: Past Surgical History:  Procedure Laterality Date  . HERNIA REPAIR     hiatal hernia   Social History: Social History   Socioeconomic History  . Marital status: Married    Spouse name: None  . Number of children: None  . Years of education: None  . Highest education level: None  Social Needs  . Financial resource strain: None  . Food insecurity - worry: None  . Food insecurity - inability: None  . Transportation needs - medical: None  . Transportation needs - non-medical: None  Occupational History  . None  Tobacco Use  . Smoking status: Current Every Day Smoker    Types: Cigars  . Smokeless tobacco: Never Used  Substance and Sexual Activity  . Alcohol use: Yes    Alcohol/week: 1.2 - 1.8 oz    Types: 2 - 3 Cans of beer per week  . Drug use: None  . Sexual activity: None  Other Topics Concern  . None  Social History Narrative  . None   Family History: Family History  Problem Relation Age of Onset  . Hypertension Mother   . Hypertension Sister   . Diabetes Sister   . Hypertension Brother   . Diabetes Maternal Aunt    Allergies: No Known Allergies Medications: See med rec.  Review of Systems: No fevers, chills, night sweats, weight loss, chest pain, or shortness of breath.   Objective:    General: Well Developed, well nourished, and in no acute distress.  Neuro: Alert and oriented x3,  extra-ocular muscles intact, sensation grossly intact.  HEENT: Normocephalic, atraumatic, pupils equal round reactive to light, neck supple, no masses, no lymphadenopathy, thyroid nonpalpable.  Skin: Warm and dry, no rashes. Cardiac: Regular rate and rhythm, no murmurs rubs or gallops, no lower extremity edema.  Respiratory: Clear to auscultation bilaterally. Not using accessory muscles, speaking in full sentences.  Impression and Recommendations:    Hives Essentially resolved now with high-dose Diflucan, return as needed.   Annual physical exam Tdap today, return in a year for a physical.  ___________________________________________ Ihor Austinhomas J. Benjamin Stainhekkekandam, M.D., ABFM., CAQSM. Primary Care and Sports Medicine [facility]  Adjunct Instructor of Family Medicine  [employer]

## 2017-07-31 DIAGNOSIS — G43909 Migraine, unspecified, not intractable, without status migrainosus: Secondary | ICD-10-CM | POA: Diagnosis not present

## 2017-09-28 ENCOUNTER — Telehealth: Payer: Self-pay

## 2017-09-28 DIAGNOSIS — L509 Urticaria, unspecified: Secondary | ICD-10-CM

## 2017-09-28 NOTE — Telephone Encounter (Signed)
Referred placed.

## 2017-09-28 NOTE — Telephone Encounter (Signed)
Wife of pt called stating pt is having bumps around his hair line and would like a referral to dermatology. Stated pt is concerned this is a reoccurrence of hives. Please advise.

## 2017-10-03 DIAGNOSIS — L309 Dermatitis, unspecified: Secondary | ICD-10-CM | POA: Diagnosis not present

## 2017-10-03 DIAGNOSIS — L282 Other prurigo: Secondary | ICD-10-CM | POA: Diagnosis not present

## 2017-10-22 DIAGNOSIS — L209 Atopic dermatitis, unspecified: Secondary | ICD-10-CM | POA: Diagnosis not present

## 2017-10-30 DIAGNOSIS — L282 Other prurigo: Secondary | ICD-10-CM | POA: Diagnosis not present

## 2017-10-30 DIAGNOSIS — L309 Dermatitis, unspecified: Secondary | ICD-10-CM | POA: Diagnosis not present

## 2017-11-02 DIAGNOSIS — G43909 Migraine, unspecified, not intractable, without status migrainosus: Secondary | ICD-10-CM | POA: Diagnosis not present

## 2017-11-30 DIAGNOSIS — L309 Dermatitis, unspecified: Secondary | ICD-10-CM | POA: Diagnosis not present

## 2017-11-30 DIAGNOSIS — Z79899 Other long term (current) drug therapy: Secondary | ICD-10-CM | POA: Diagnosis not present

## 2017-12-03 DIAGNOSIS — L731 Pseudofolliculitis barbae: Secondary | ICD-10-CM | POA: Diagnosis not present

## 2017-12-24 DIAGNOSIS — L731 Pseudofolliculitis barbae: Secondary | ICD-10-CM | POA: Diagnosis not present

## 2017-12-24 DIAGNOSIS — L209 Atopic dermatitis, unspecified: Secondary | ICD-10-CM | POA: Diagnosis not present

## 2017-12-24 DIAGNOSIS — L509 Urticaria, unspecified: Secondary | ICD-10-CM | POA: Diagnosis not present

## 2018-02-05 ENCOUNTER — Other Ambulatory Visit: Payer: Self-pay | Admitting: Sports Medicine

## 2018-02-05 DIAGNOSIS — I10 Essential (primary) hypertension: Secondary | ICD-10-CM

## 2018-02-05 NOTE — Telephone Encounter (Signed)
MUST MAKE APPOINTMENT 

## 2018-03-12 DIAGNOSIS — L309 Dermatitis, unspecified: Secondary | ICD-10-CM | POA: Diagnosis not present

## 2018-03-12 DIAGNOSIS — L282 Other prurigo: Secondary | ICD-10-CM | POA: Diagnosis not present

## 2018-03-12 DIAGNOSIS — Z79899 Other long term (current) drug therapy: Secondary | ICD-10-CM | POA: Diagnosis not present

## 2018-03-18 ENCOUNTER — Ambulatory Visit (INDEPENDENT_AMBULATORY_CARE_PROVIDER_SITE_OTHER): Payer: BLUE CROSS/BLUE SHIELD | Admitting: Sports Medicine

## 2018-03-18 ENCOUNTER — Encounter: Payer: Self-pay | Admitting: Sports Medicine

## 2018-03-18 VITALS — BP 137/85 | HR 66 | Ht 71.0 in | Wt 265.0 lb

## 2018-03-18 DIAGNOSIS — I10 Essential (primary) hypertension: Secondary | ICD-10-CM | POA: Diagnosis not present

## 2018-03-18 DIAGNOSIS — Z23 Encounter for immunization: Secondary | ICD-10-CM | POA: Diagnosis not present

## 2018-03-18 DIAGNOSIS — E781 Pure hyperglyceridemia: Secondary | ICD-10-CM | POA: Diagnosis not present

## 2018-03-18 DIAGNOSIS — Z0001 Encounter for general adult medical examination with abnormal findings: Secondary | ICD-10-CM | POA: Diagnosis not present

## 2018-03-18 DIAGNOSIS — R7303 Prediabetes: Secondary | ICD-10-CM

## 2018-03-18 DIAGNOSIS — F321 Major depressive disorder, single episode, moderate: Secondary | ICD-10-CM

## 2018-03-18 DIAGNOSIS — M170 Bilateral primary osteoarthritis of knee: Secondary | ICD-10-CM | POA: Diagnosis not present

## 2018-03-18 DIAGNOSIS — Z0000 Encounter for general adult medical examination without abnormal findings: Secondary | ICD-10-CM

## 2018-03-18 MED ORDER — SERTRALINE HCL 50 MG PO TABS: 50.0000 mg | ORAL_TABLET | Freq: Every day | ORAL | 3 refills | Status: DC

## 2018-03-18 MED ORDER — CELECOXIB 200 MG PO CAPS: ORAL_CAPSULE | ORAL | 2 refills | Status: DC

## 2018-03-18 NOTE — Assessment & Plan Note (Signed)
Starting Zoloft 50, return in 1 month for PHQ and GAD

## 2018-03-18 NOTE — Assessment & Plan Note (Signed)
Starting NSAIDs first, Celebrex. He will return in 2 to 4 weeks for an injection if no better.

## 2018-03-18 NOTE — Assessment & Plan Note (Signed)
Rechecking hemoglobin A1c 

## 2018-03-18 NOTE — Assessment & Plan Note (Signed)
Rechecking lipids. 

## 2018-03-18 NOTE — Progress Notes (Signed)
Subjective:    CC: Annual physical  HPI:  This is a pleasant 57 year old male, here for his physical, he does have significant symptoms of depression without suicidal or homicidal ideation.  He also has knee osteoarthritis, pain is moderate, persistent, bilateral, no mechanical symptoms or trauma.  Has not tried any NSAIDs, we did a series of Visco supplementation and other injections years ago.  Hypertension: Elevated but improved on recheck.  I reviewed the past medical history, family history, social history, surgical history, and allergies today and no changes were needed.  Please see the problem list section below in epic for further details.  Past Medical History: Past Medical History:  Diagnosis Date  . Hypertension   . Sleep apnea    Past Surgical History: Past Surgical History:  Procedure Laterality Date  . HERNIA REPAIR     hiatal hernia   Social History: Social History   Socioeconomic History  . Marital status: Married    Spouse name: Not on file  . Number of children: Not on file  . Years of education: Not on file  . Highest education level: Not on file  Occupational History  . Not on file  Social Needs  . Financial resource strain: Not on file  . Food insecurity:    Worry: Not on file    Inability: Not on file  . Transportation needs:    Medical: Not on file    Non-medical: Not on file  Tobacco Use  . Smoking status: Current Every Day Smoker    Types: Cigars  . Smokeless tobacco: Never Used  Substance and Sexual Activity  . Alcohol use: Yes    Alcohol/week: 2.0 - 3.0 standard drinks    Types: 2 - 3 Cans of beer per week  . Drug use: Not on file  . Sexual activity: Not on file  Lifestyle  . Physical activity:    Days per week: Not on file    Minutes per session: Not on file  . Stress: Not on file  Relationships  . Social connections:    Talks on phone: Not on file    Gets together: Not on file    Attends religious service: Not on file   Active member of club or organization: Not on file    Attends meetings of clubs or organizations: Not on file    Relationship status: Not on file  Other Topics Concern  . Not on file  Social History Narrative  . Not on file   Family History: Family History  Problem Relation Age of Onset  . Hypertension Mother   . Hypertension Sister   . Diabetes Sister   . Hypertension Brother   . Diabetes Maternal Aunt    Allergies: No Known Allergies Medications: See med rec.  Review of Systems: No headache, visual changes, nausea, vomiting, diarrhea, constipation, dizziness, abdominal pain, skin rash, fevers, chills, night sweats, swollen lymph nodes, weight loss, chest pain, body aches, joint swelling, muscle aches, shortness of breath, mood changes, visual or auditory hallucinations.  Objective:    General: Well Developed, well nourished, and in no acute distress.  Neuro: Alert and oriented x3, extra-ocular muscles intact, sensation grossly intact. Cranial nerves II through XII are intact, motor, sensory, and coordinative functions are all intact. HEENT: Normocephalic, atraumatic, pupils equal round reactive to light, neck supple, no masses, no lymphadenopathy, thyroid nonpalpable. Oropharynx, nasopharynx, external ear canals are unremarkable. Skin: Warm and dry, no rashes noted.  Cardiac: Regular rate and rhythm, no murmurs  rubs or gallops.  Respiratory: Clear to auscultation bilaterally. Not using accessory muscles, speaking in full sentences.  Abdominal: Soft, nontender, nondistended, positive bowel sounds, no masses, no organomegaly.  Bilateral knees: Tenderness at the medial joint lines with mild effusion bilaterally. ROM normal in flexion and extension and lower leg rotation. Ligaments with solid consistent endpoints including ACL, PCL, LCL, MCL. Negative Mcmurray's and provocative meniscal tests. Non painful patellar compression. Patellar and quadriceps tendons  unremarkable. Hamstring and quadriceps strength is normal.  Impression and Recommendations:    The patient was counselled, risk factors were discussed, anticipatory guidance given.  Annual physical exam Annual physical as above.  Essential hypertension, benign Elevated, rechecking.   Hypertriglyceridemia Rechecking lipids.  Prediabetes Rechecking hemoglobin A1c  Primary osteoarthritis of both knees Starting NSAIDs first, Celebrex. He will return in 2 to 4 weeks for an injection if no better.  Depression, major, single episode, moderate (HCC) Starting Zoloft 50, return in 1 month for PHQ and GAD ___________________________________________ Ihor Austin. Benjamin Stain, M.D., ABFM., CAQSM. Primary Care and Sports Medicine [facility]  Adjunct Professor of Family Medicine  [employer]

## 2018-03-18 NOTE — Assessment & Plan Note (Signed)
Elevated, rechecking.

## 2018-03-18 NOTE — Assessment & Plan Note (Signed)
Annual physical as above.  

## 2018-03-21 DIAGNOSIS — Z0000 Encounter for general adult medical examination without abnormal findings: Secondary | ICD-10-CM | POA: Diagnosis not present

## 2018-03-21 DIAGNOSIS — R7303 Prediabetes: Secondary | ICD-10-CM | POA: Diagnosis not present

## 2018-03-21 DIAGNOSIS — E781 Pure hyperglyceridemia: Secondary | ICD-10-CM | POA: Diagnosis not present

## 2018-03-22 LAB — COMPREHENSIVE METABOLIC PANEL
AG Ratio: 1.3 (calc) (ref 1.0–2.5)
Albumin: 4 g/dL (ref 3.6–5.1)
CO2: 28 mmol/L (ref 20–32)
Calcium: 9.1 mg/dL (ref 8.6–10.3)
Globulin: 3 g/dL (calc) (ref 1.9–3.7)
Glucose, Bld: 99 mg/dL (ref 65–99)
Potassium: 4.3 mmol/L (ref 3.5–5.3)
Sodium: 140 mmol/L (ref 135–146)
Total Bilirubin: 0.4 mg/dL (ref 0.2–1.2)
Total Protein: 7 g/dL (ref 6.1–8.1)

## 2018-03-22 LAB — CBC
HCT: 41.2 % (ref 38.5–50.0)
Hemoglobin: 13.7 g/dL (ref 13.2–17.1)
MCH: 29.5 pg (ref 27.0–33.0)
MCHC: 33.3 g/dL (ref 32.0–36.0)
MCV: 88.6 fL (ref 80.0–100.0)
MPV: 10.2 fL (ref 7.5–12.5)
Platelets: 272 10*3/uL (ref 140–400)
RBC: 4.65 10*6/uL (ref 4.20–5.80)
RDW: 12.8 % (ref 11.0–15.0)
WBC: 8 10*3/uL (ref 3.8–10.8)

## 2018-03-22 LAB — LIPID PANEL W/REFLEX DIRECT LDL
Cholesterol: 202 mg/dL — ABNORMAL HIGH (ref <200)
HDL: 74 mg/dL (ref >40)
LDL Cholesterol (Calc): 112 mg/dL (calc) — ABNORMAL HIGH
Non-HDL Cholesterol (Calc): 128 mg/dL (calc) (ref <130)
Total CHOL/HDL Ratio: 2.7 (calc) (ref <5.0)
Triglycerides: 72 mg/dL (ref <150)

## 2018-03-22 LAB — COMPREHENSIVE METABOLIC PANEL WITH GFR
ALT: 55 U/L — ABNORMAL HIGH (ref 9–46)
AST: 47 U/L — ABNORMAL HIGH (ref 10–35)
Alkaline phosphatase (APISO): 58 U/L (ref 40–115)
BUN: 11 mg/dL (ref 7–25)
Chloride: 105 mmol/L (ref 98–110)
Creat: 1.14 mg/dL (ref 0.70–1.33)

## 2018-03-22 LAB — PSA, TOTAL AND FREE
PSA, % Free: 25 % — ABNORMAL LOW (ref >25)
PSA, Free: 0.2 ng/mL
PSA, Total: 0.8 ng/mL (ref <=4.0)

## 2018-03-22 LAB — HEMOGLOBIN A1C
Hgb A1c MFr Bld: 6.2 % of total Hgb — ABNORMAL HIGH (ref <5.7)
Mean Plasma Glucose: 131 (calc)
eAG (mmol/L): 7.3 (calc)

## 2018-03-22 LAB — TSH: TSH: 2.3 m[IU]/L (ref 0.40–4.50)

## 2018-03-27 DIAGNOSIS — Z0000 Encounter for general adult medical examination without abnormal findings: Secondary | ICD-10-CM | POA: Diagnosis not present

## 2018-03-27 DIAGNOSIS — I10 Essential (primary) hypertension: Secondary | ICD-10-CM | POA: Diagnosis not present

## 2018-03-27 DIAGNOSIS — N529 Male erectile dysfunction, unspecified: Secondary | ICD-10-CM | POA: Diagnosis not present

## 2018-04-16 ENCOUNTER — Ambulatory Visit (INDEPENDENT_AMBULATORY_CARE_PROVIDER_SITE_OTHER): Payer: BLUE CROSS/BLUE SHIELD | Admitting: Sports Medicine

## 2018-04-16 ENCOUNTER — Encounter: Payer: Self-pay | Admitting: Sports Medicine

## 2018-04-16 DIAGNOSIS — E6609 Other obesity due to excess calories: Secondary | ICD-10-CM | POA: Diagnosis not present

## 2018-04-16 DIAGNOSIS — I10 Essential (primary) hypertension: Secondary | ICD-10-CM

## 2018-04-16 DIAGNOSIS — F321 Major depressive disorder, single episode, moderate: Secondary | ICD-10-CM

## 2018-04-16 DIAGNOSIS — Z6830 Body mass index (BMI) 30.0-30.9, adult: Secondary | ICD-10-CM

## 2018-04-16 MED ORDER — PHENTERMINE HCL 37.5 MG PO TABS: ORAL_TABLET | ORAL | 0 refills | Status: DC

## 2018-04-16 MED ORDER — SERTRALINE HCL 100 MG PO TABS: 100.0000 mg | ORAL_TABLET | Freq: Every day | ORAL | 3 refills | Status: DC

## 2018-04-16 NOTE — Assessment & Plan Note (Signed)
Well-controlled now, no changes.

## 2018-04-16 NOTE — Assessment & Plan Note (Signed)
Now the blood pressure is controlled patient is requesting to restart weight loss treatment, restarting phentermine. A lot of his depressed mood he tells me is related to his weight gain. Return monthly for weight checks and refills.

## 2018-04-16 NOTE — Progress Notes (Signed)
Subjective:    CC: Multiple issues  HPI: Hypertension: Well-controlled.  Obesity: Would like to get back on weight loss medicine.  Depression: Did not get much response to 50 of Zoloft.  No adverse effects.  I reviewed the past medical history, family history, social history, surgical history, and allergies today and no changes were needed.  Please see the problem list section below in epic for further details.  Past Medical History: Past Medical History:  Diagnosis Date  . Hypertension   . Sleep apnea    Past Surgical History: Past Surgical History:  Procedure Laterality Date  . HERNIA REPAIR     hiatal hernia   Social History: Social History   Socioeconomic History  . Marital status: Married    Spouse name: Not on file  . Number of children: Not on file  . Years of education: Not on file  . Highest education level: Not on file  Occupational History  . Not on file  Social Needs  . Financial resource strain: Not on file  . Food insecurity:    Worry: Not on file    Inability: Not on file  . Transportation needs:    Medical: Not on file    Non-medical: Not on file  Tobacco Use  . Smoking status: Current Every Day Smoker    Types: Cigars  . Smokeless tobacco: Never Used  Substance and Sexual Activity  . Alcohol use: Yes    Alcohol/week: 2.0 - 3.0 standard drinks    Types: 2 - 3 Cans of beer per week  . Drug use: Not on file  . Sexual activity: Not on file  Lifestyle  . Physical activity:    Days per week: Not on file    Minutes per session: Not on file  . Stress: Not on file  Relationships  . Social connections:    Talks on phone: Not on file    Gets together: Not on file    Attends religious service: Not on file    Active member of club or organization: Not on file    Attends meetings of clubs or organizations: Not on file    Relationship status: Not on file  Other Topics Concern  . Not on file  Social History Narrative  . Not on file   Family  History: Family History  Problem Relation Age of Onset  . Hypertension Mother   . Hypertension Sister   . Diabetes Sister   . Hypertension Brother   . Diabetes Maternal Aunt    Allergies: No Known Allergies Medications: See med rec.  Review of Systems: No fevers, chills, night sweats, weight loss, chest pain, or shortness of breath.   Objective:    General: Well Developed, well nourished, and in no acute distress.  Neuro: Alert and oriented x3, extra-ocular muscles intact, sensation grossly intact.  HEENT: Normocephalic, atraumatic, pupils equal round reactive to light, neck supple, no masses, no lymphadenopathy, thyroid nonpalpable.  Skin: Warm and dry, no rashes. Cardiac: Regular rate and rhythm, no murmurs rubs or gallops, no lower extremity edema.  Respiratory: Clear to auscultation bilaterally. Not using accessory muscles, speaking in full sentences.  Impression and Recommendations:    Depression, major, single episode, moderate (HCC) No improvement on 50 of Zoloft, increasing to 100 mg. [facility]. Return in 1 month for PHQ and GAD.  Essential hypertension, benign Well-controlled now, no changes.  Obesity Now the blood pressure is controlled patient is requesting to restart  weight loss treatment, restarting phentermine. A lot of his depressed mood he tells me is related to his weight gain. Return monthly for weight checks and refills. ___________________________________________ Ihor Austin. Benjamin Stain, M.D., ABFM., CAQSM. Primary Care and Sports Medicine [facility]  Adjunct Professor of Family Medicine  [employer]

## 2018-04-16 NOTE — Assessment & Plan Note (Signed)
No improvement on 50 of Zoloft, increasing to 100 mg. He is [facility]. Return in 1 month for PHQ and GAD.

## 2018-04-22 DIAGNOSIS — R030 Elevated blood-pressure reading, without diagnosis of hypertension: Secondary | ICD-10-CM | POA: Diagnosis not present

## 2018-04-22 DIAGNOSIS — M545 Low back pain: Secondary | ICD-10-CM | POA: Diagnosis not present

## 2018-04-22 DIAGNOSIS — M25551 Pain in right hip: Secondary | ICD-10-CM | POA: Diagnosis not present

## 2018-05-02 DIAGNOSIS — E785 Hyperlipidemia, unspecified: Secondary | ICD-10-CM | POA: Diagnosis not present

## 2018-05-02 DIAGNOSIS — F329 Major depressive disorder, single episode, unspecified: Secondary | ICD-10-CM | POA: Diagnosis not present

## 2018-05-02 DIAGNOSIS — G8911 Acute pain due to trauma: Secondary | ICD-10-CM | POA: Diagnosis not present

## 2018-05-02 DIAGNOSIS — M5489 Other dorsalgia: Secondary | ICD-10-CM | POA: Diagnosis not present

## 2018-05-02 DIAGNOSIS — F17290 Nicotine dependence, other tobacco product, uncomplicated: Secondary | ICD-10-CM | POA: Diagnosis not present

## 2018-05-02 DIAGNOSIS — I10 Essential (primary) hypertension: Secondary | ICD-10-CM | POA: Diagnosis not present

## 2018-05-02 DIAGNOSIS — R51 Headache: Secondary | ICD-10-CM | POA: Diagnosis not present

## 2018-05-02 DIAGNOSIS — S39012A Strain of muscle, fascia and tendon of lower back, initial encounter: Secondary | ICD-10-CM | POA: Diagnosis not present

## 2018-05-02 DIAGNOSIS — Z79899 Other long term (current) drug therapy: Secondary | ICD-10-CM | POA: Diagnosis not present

## 2018-05-03 DIAGNOSIS — G43709 Chronic migraine without aura, not intractable, without status migrainosus: Secondary | ICD-10-CM | POA: Diagnosis not present

## 2018-05-06 ENCOUNTER — Other Ambulatory Visit: Payer: Self-pay | Admitting: Sports Medicine

## 2018-05-06 DIAGNOSIS — I10 Essential (primary) hypertension: Secondary | ICD-10-CM

## 2018-05-14 ENCOUNTER — Ambulatory Visit: Payer: BLUE CROSS/BLUE SHIELD | Admitting: Sports Medicine

## 2018-05-14 DIAGNOSIS — H04123 Dry eye syndrome of bilateral lacrimal glands: Secondary | ICD-10-CM | POA: Diagnosis not present

## 2018-05-14 DIAGNOSIS — H35013 Changes in retinal vascular appearance, bilateral: Secondary | ICD-10-CM | POA: Diagnosis not present

## 2018-05-14 DIAGNOSIS — H43813 Vitreous degeneration, bilateral: Secondary | ICD-10-CM | POA: Diagnosis not present

## 2018-05-14 DIAGNOSIS — I10 Essential (primary) hypertension: Secondary | ICD-10-CM | POA: Diagnosis not present

## 2018-06-03 DIAGNOSIS — H524 Presbyopia: Secondary | ICD-10-CM | POA: Diagnosis not present

## 2018-06-10 DIAGNOSIS — M545 Low back pain: Secondary | ICD-10-CM | POA: Diagnosis not present

## 2018-06-24 DIAGNOSIS — M545 Low back pain: Secondary | ICD-10-CM | POA: Diagnosis not present

## 2018-06-24 DIAGNOSIS — H401410 Capsular glaucoma with pseudoexfoliation of lens, right eye, stage unspecified: Secondary | ICD-10-CM | POA: Diagnosis not present

## 2018-06-27 IMAGING — DX DG HIP (WITH OR WITHOUT PELVIS) 2-3V*R*
3 series · 3 of 3 positions shown · non-contrast
Comparison: None.

CLINICAL DATA: Right hip pain.  No known injury.

EXAM:
DG HIP (WITH OR WITHOUT PELVIS) 2-3V RIGHT

[pelvis ap]
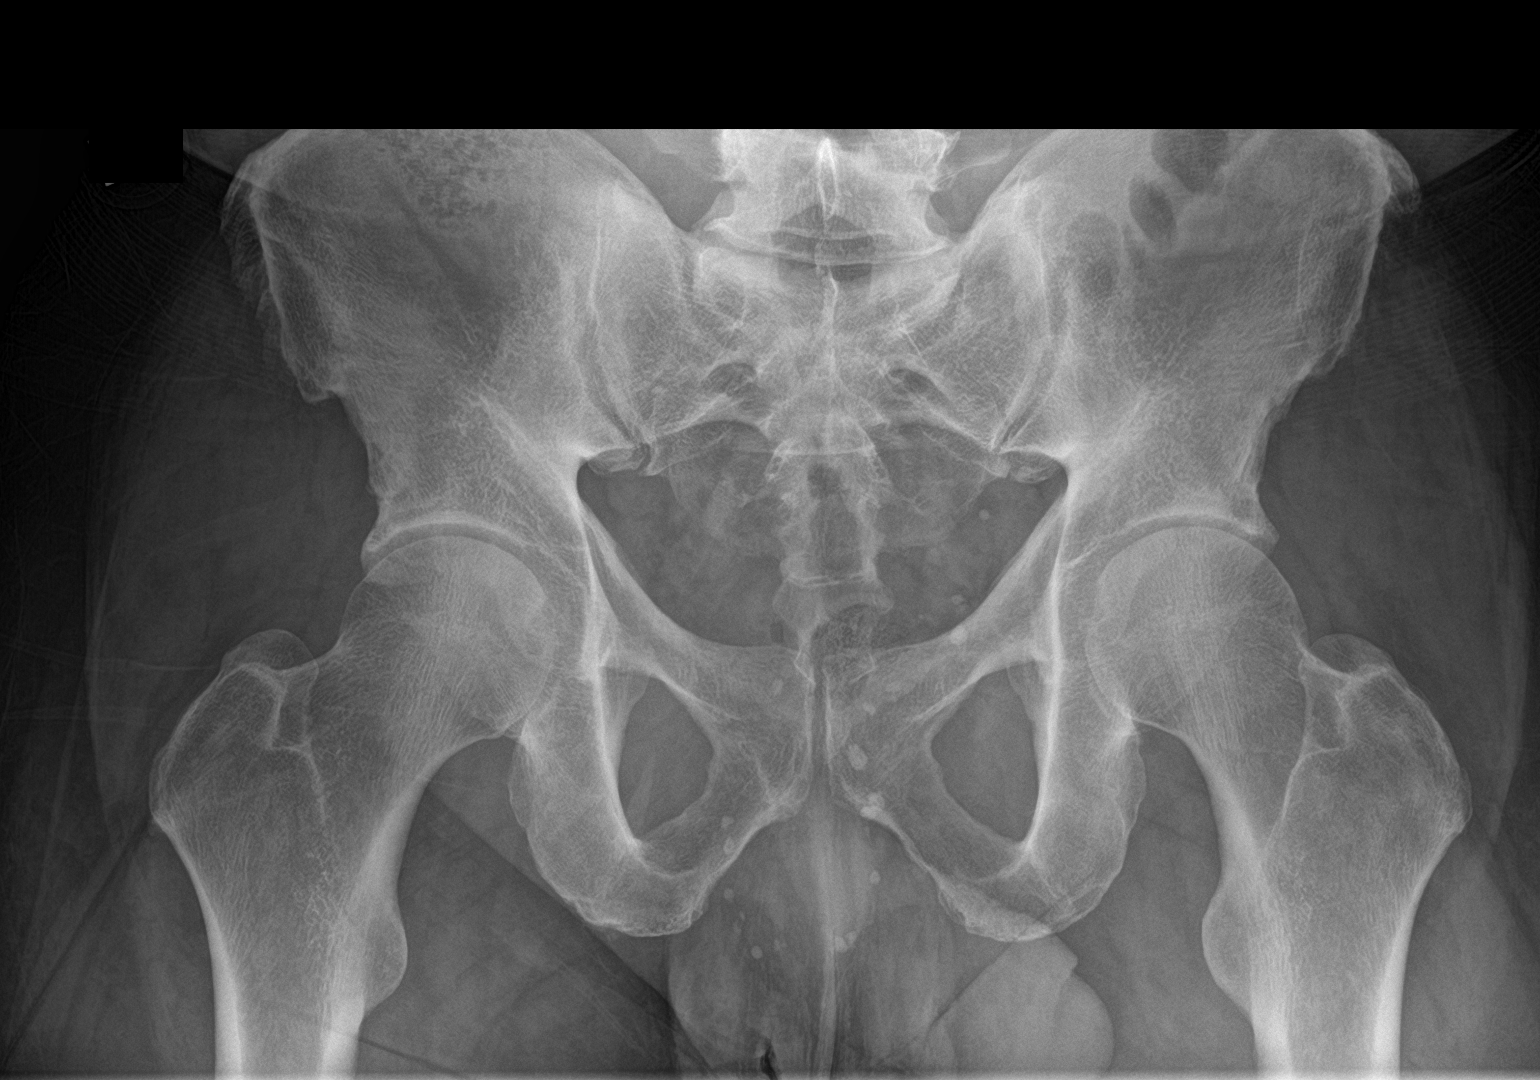

[hip ap]
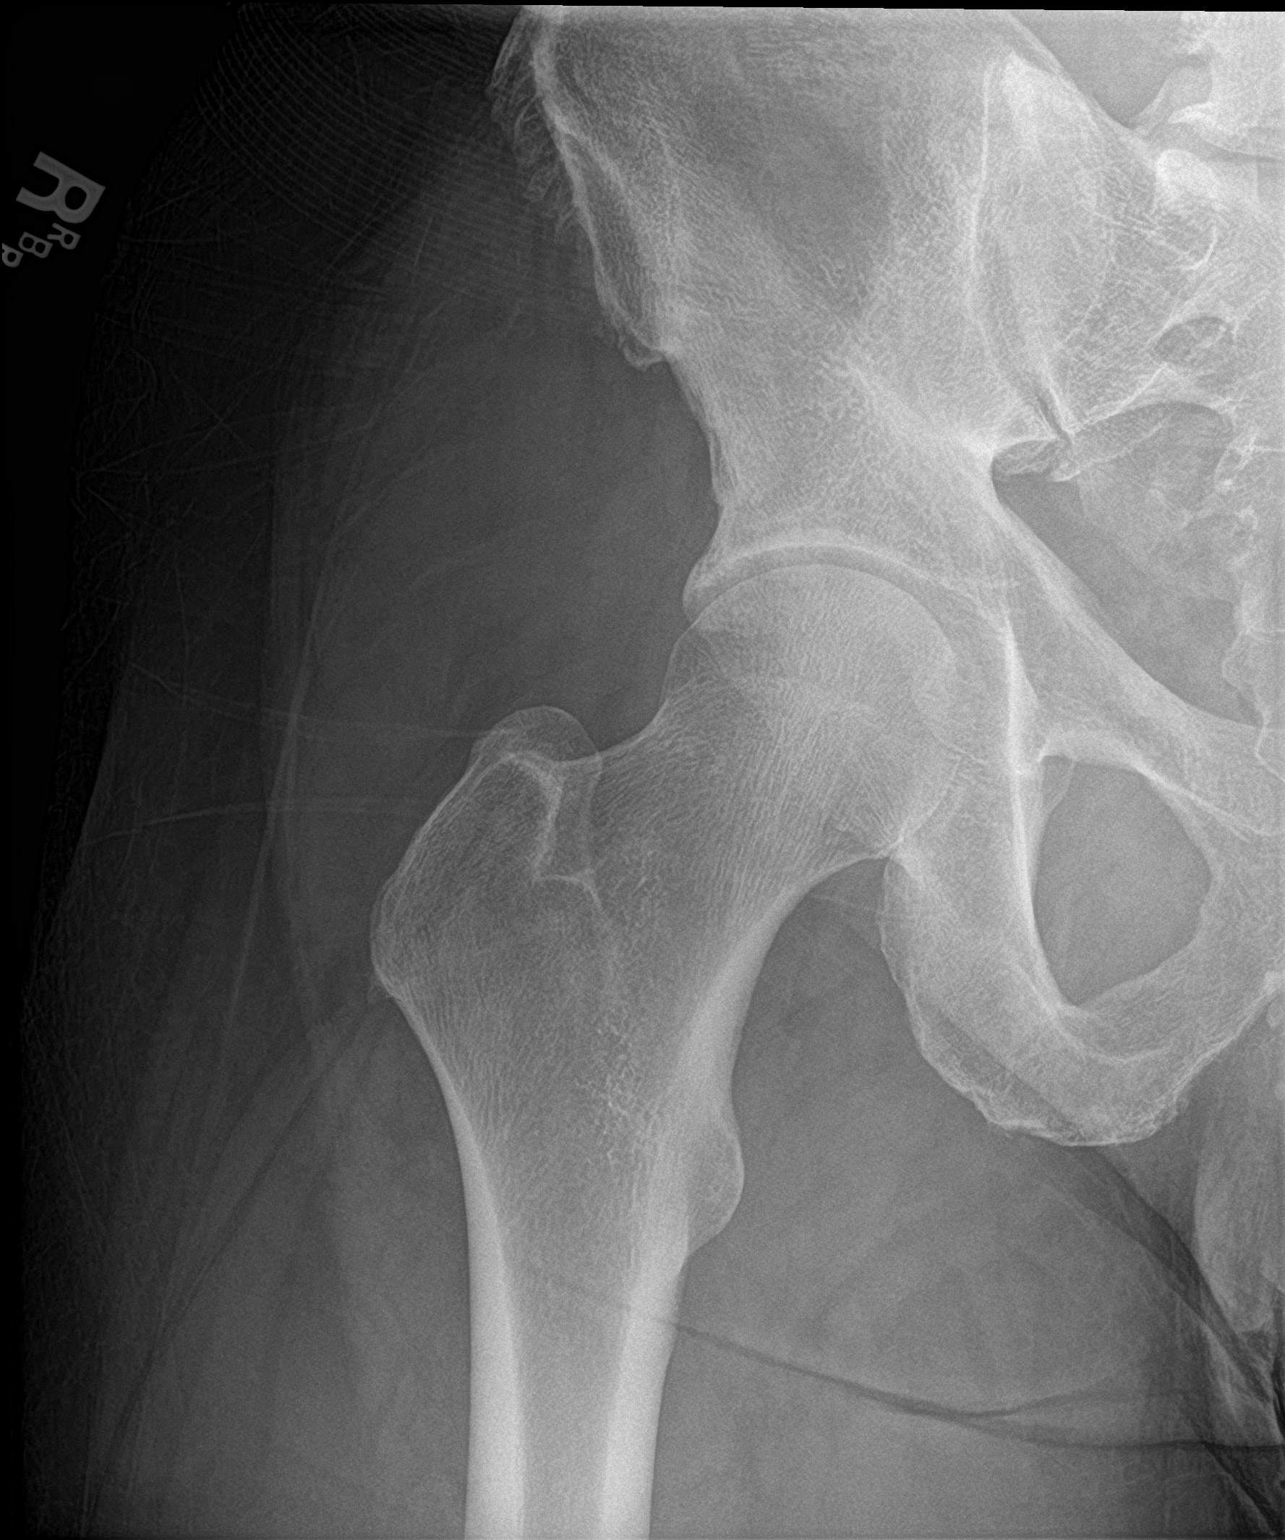

[hip lat]
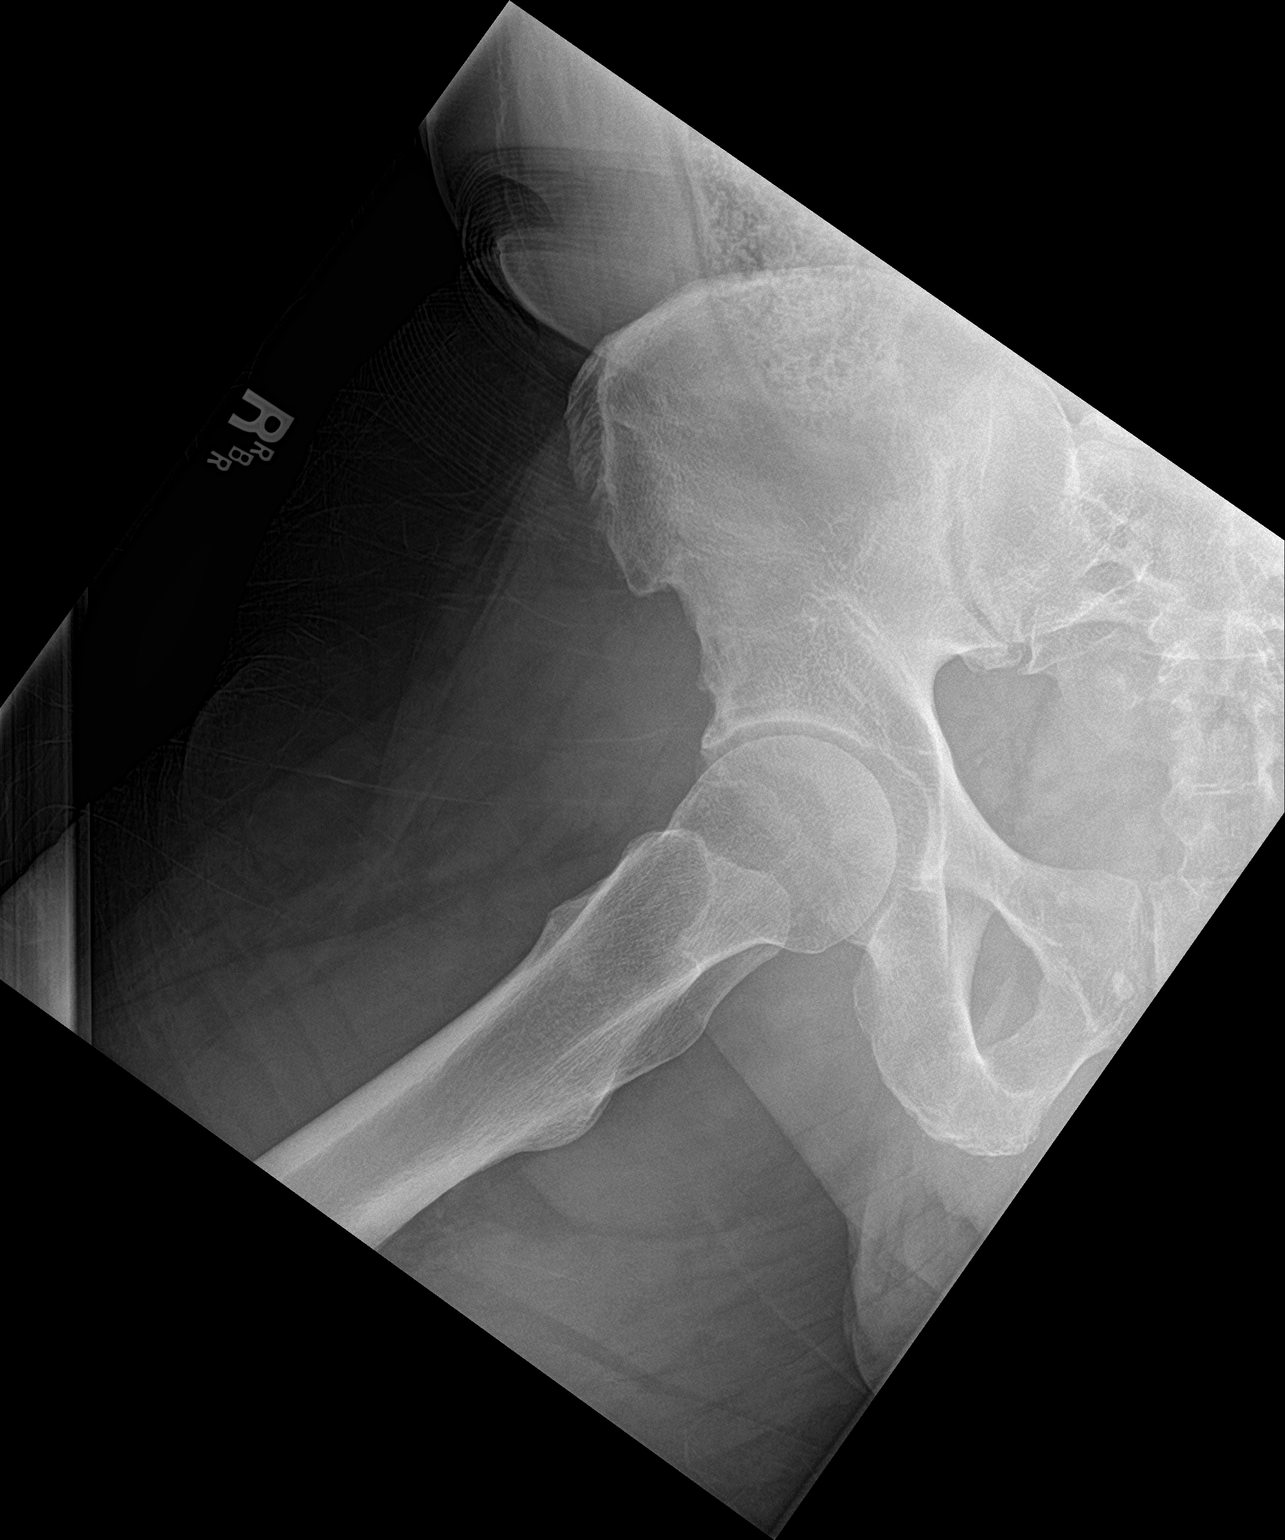

[3 of 3 positions shown; findings below may reference images not displayed]

FINDINGS: There is no evidence of hip fracture or dislocation. There is no
evidence of arthropathy or other focal bone abnormality.
IMPRESSION: Negative.

## 2018-07-15 DIAGNOSIS — L509 Urticaria, unspecified: Secondary | ICD-10-CM | POA: Diagnosis not present

## 2018-07-15 DIAGNOSIS — L200 Besnier's prurigo: Secondary | ICD-10-CM | POA: Diagnosis not present

## 2018-07-15 DIAGNOSIS — L209 Atopic dermatitis, unspecified: Secondary | ICD-10-CM | POA: Diagnosis not present

## 2018-07-15 DIAGNOSIS — L731 Pseudofolliculitis barbae: Secondary | ICD-10-CM | POA: Diagnosis not present

## 2018-09-10 DIAGNOSIS — L309 Dermatitis, unspecified: Secondary | ICD-10-CM | POA: Diagnosis not present

## 2018-09-10 DIAGNOSIS — L282 Other prurigo: Secondary | ICD-10-CM | POA: Diagnosis not present

## 2018-09-10 DIAGNOSIS — Z79899 Other long term (current) drug therapy: Secondary | ICD-10-CM | POA: Diagnosis not present

## 2018-09-12 DIAGNOSIS — G43909 Migraine, unspecified, not intractable, without status migrainosus: Secondary | ICD-10-CM | POA: Diagnosis not present

## 2018-11-05 DIAGNOSIS — F331 Major depressive disorder, recurrent, moderate: Secondary | ICD-10-CM | POA: Diagnosis not present

## 2018-11-05 DIAGNOSIS — G4733 Obstructive sleep apnea (adult) (pediatric): Secondary | ICD-10-CM | POA: Diagnosis not present

## 2018-12-11 DIAGNOSIS — L309 Dermatitis, unspecified: Secondary | ICD-10-CM | POA: Diagnosis not present

## 2018-12-11 DIAGNOSIS — Z79899 Other long term (current) drug therapy: Secondary | ICD-10-CM | POA: Diagnosis not present

## 2019-01-29 DIAGNOSIS — Z9989 Dependence on other enabling machines and devices: Secondary | ICD-10-CM | POA: Diagnosis not present

## 2019-01-29 DIAGNOSIS — G4733 Obstructive sleep apnea (adult) (pediatric): Secondary | ICD-10-CM | POA: Diagnosis not present

## 2019-01-29 DIAGNOSIS — F331 Major depressive disorder, recurrent, moderate: Secondary | ICD-10-CM | POA: Diagnosis not present

## 2019-02-06 DIAGNOSIS — Z23 Encounter for immunization: Secondary | ICD-10-CM | POA: Diagnosis not present

## 2019-04-14 DIAGNOSIS — L309 Dermatitis, unspecified: Secondary | ICD-10-CM | POA: Diagnosis not present

## 2019-04-14 DIAGNOSIS — Z79899 Other long term (current) drug therapy: Secondary | ICD-10-CM | POA: Diagnosis not present

## 2019-04-14 DIAGNOSIS — L282 Other prurigo: Secondary | ICD-10-CM | POA: Diagnosis not present

## 2019-04-14 DIAGNOSIS — L281 Prurigo nodularis: Secondary | ICD-10-CM | POA: Diagnosis not present

## 2019-05-23 DIAGNOSIS — G4733 Obstructive sleep apnea (adult) (pediatric): Secondary | ICD-10-CM | POA: Diagnosis not present

## 2019-06-09 DIAGNOSIS — Z125 Encounter for screening for malignant neoplasm of prostate: Secondary | ICD-10-CM | POA: Diagnosis not present

## 2019-06-09 DIAGNOSIS — I10 Essential (primary) hypertension: Secondary | ICD-10-CM | POA: Diagnosis not present

## 2019-06-09 DIAGNOSIS — B351 Tinea unguium: Secondary | ICD-10-CM | POA: Diagnosis not present

## 2019-06-18 DIAGNOSIS — G4733 Obstructive sleep apnea (adult) (pediatric): Secondary | ICD-10-CM | POA: Diagnosis not present

## 2019-06-25 DIAGNOSIS — G4733 Obstructive sleep apnea (adult) (pediatric): Secondary | ICD-10-CM | POA: Diagnosis not present

## 2019-06-27 DIAGNOSIS — Z23 Encounter for immunization: Secondary | ICD-10-CM | POA: Diagnosis not present

## 2019-07-17 ENCOUNTER — Telehealth: Payer: Self-pay | Admitting: *Deleted

## 2019-07-17 DIAGNOSIS — I10 Essential (primary) hypertension: Secondary | ICD-10-CM

## 2019-07-17 DIAGNOSIS — R972 Elevated prostate specific antigen [PSA]: Secondary | ICD-10-CM

## 2019-07-17 DIAGNOSIS — E781 Pure hyperglyceridemia: Secondary | ICD-10-CM

## 2019-07-17 DIAGNOSIS — R7303 Prediabetes: Secondary | ICD-10-CM

## 2019-07-17 DIAGNOSIS — E6609 Other obesity due to excess calories: Secondary | ICD-10-CM

## 2019-07-17 DIAGNOSIS — Z6830 Body mass index (BMI) 30.0-30.9, adult: Secondary | ICD-10-CM

## 2019-07-17 DIAGNOSIS — Z0000 Encounter for general adult medical examination without abnormal findings: Secondary | ICD-10-CM

## 2019-07-17 NOTE — Telephone Encounter (Signed)
Labs ordered for upcoming cpe

## 2019-07-18 DIAGNOSIS — I10 Essential (primary) hypertension: Secondary | ICD-10-CM | POA: Diagnosis not present

## 2019-07-18 DIAGNOSIS — R972 Elevated prostate specific antigen [PSA]: Secondary | ICD-10-CM | POA: Diagnosis not present

## 2019-07-18 DIAGNOSIS — E781 Pure hyperglyceridemia: Secondary | ICD-10-CM | POA: Diagnosis not present

## 2019-07-18 DIAGNOSIS — Z23 Encounter for immunization: Secondary | ICD-10-CM | POA: Diagnosis not present

## 2019-07-18 DIAGNOSIS — R7303 Prediabetes: Secondary | ICD-10-CM | POA: Diagnosis not present

## 2019-07-21 ENCOUNTER — Other Ambulatory Visit: Payer: Self-pay

## 2019-07-21 ENCOUNTER — Encounter: Payer: Self-pay | Admitting: Sports Medicine

## 2019-07-21 ENCOUNTER — Ambulatory Visit (INDEPENDENT_AMBULATORY_CARE_PROVIDER_SITE_OTHER): Payer: BC Managed Care – PPO | Admitting: Sports Medicine

## 2019-07-21 VITALS — BP 168/108 | HR 61 | Wt 273.1 lb

## 2019-07-21 DIAGNOSIS — G4733 Obstructive sleep apnea (adult) (pediatric): Secondary | ICD-10-CM | POA: Insufficient documentation

## 2019-07-21 DIAGNOSIS — I10 Essential (primary) hypertension: Secondary | ICD-10-CM

## 2019-07-21 DIAGNOSIS — F321 Major depressive disorder, single episode, moderate: Secondary | ICD-10-CM

## 2019-07-21 DIAGNOSIS — M170 Bilateral primary osteoarthritis of knee: Secondary | ICD-10-CM | POA: Diagnosis not present

## 2019-07-21 DIAGNOSIS — Z0000 Encounter for general adult medical examination without abnormal findings: Secondary | ICD-10-CM

## 2019-07-21 DIAGNOSIS — R7303 Prediabetes: Secondary | ICD-10-CM

## 2019-07-21 DIAGNOSIS — E785 Hyperlipidemia, unspecified: Secondary | ICD-10-CM | POA: Insufficient documentation

## 2019-07-21 LAB — CBC
HCT: 42.2 % (ref 38.5–50.0)
Hemoglobin: 14.1 g/dL (ref 13.2–17.1)
MCH: 30.5 pg (ref 27.0–33.0)
MCHC: 33.4 g/dL (ref 32.0–36.0)
MCV: 91.3 fL (ref 80.0–100.0)
MPV: 10.5 fL (ref 7.5–12.5)
Platelets: 237 10*3/uL (ref 140–400)
RBC: 4.62 10*6/uL (ref 4.20–5.80)
RDW: 12.8 % (ref 11.0–15.0)
WBC: 9.4 10*3/uL (ref 3.8–10.8)

## 2019-07-21 LAB — COMPLETE METABOLIC PANEL WITH GFR
AG Ratio: 1.6 (calc) (ref 1.0–2.5)
ALT: 51 U/L — ABNORMAL HIGH (ref 9–46)
AST: 30 U/L (ref 10–35)
Albumin: 4.2 g/dL (ref 3.6–5.1)
Alkaline phosphatase (APISO): 59 U/L (ref 35–144)
BUN: 16 mg/dL (ref 7–25)
CO2: 27 mmol/L (ref 20–32)
Calcium: 9.1 mg/dL (ref 8.6–10.3)
Chloride: 105 mmol/L (ref 98–110)
Creat: 1.09 mg/dL (ref 0.70–1.33)
GFR, Est African American: 86 mL/min/{1.73_m2} (ref >=60)
GFR, Est Non African American: 74 mL/min/{1.73_m2} (ref >=60)
Globulin: 2.7 g/dL (calc) (ref 1.9–3.7)
Glucose, Bld: 104 mg/dL — ABNORMAL HIGH (ref 65–99)
Potassium: 4.4 mmol/L (ref 3.5–5.3)
Sodium: 140 mmol/L (ref 135–146)
Total Bilirubin: 0.3 mg/dL (ref 0.2–1.2)
Total Protein: 6.9 g/dL (ref 6.1–8.1)

## 2019-07-21 LAB — PSA, TOTAL AND FREE
PSA, % Free: 50 % (calc) (ref >25)
PSA, Free: 0.1 ng/mL
PSA, Total: 0.2 ng/mL (ref <=4.0)

## 2019-07-21 LAB — HEMOGLOBIN A1C
Hgb A1c MFr Bld: 6 % of total Hgb — ABNORMAL HIGH (ref <5.7)
Mean Plasma Glucose: 126 (calc)
eAG (mmol/L): 7 (calc)

## 2019-07-21 LAB — TSH: TSH: 3.24 mIU/L (ref 0.40–4.50)

## 2019-07-21 LAB — LIPID PANEL W/REFLEX DIRECT LDL
Cholesterol: 196 mg/dL (ref <200)
HDL: 69 mg/dL (ref >=40)
LDL Cholesterol (Calc): 109 mg/dL (calc) — ABNORMAL HIGH
Non-HDL Cholesterol (Calc): 127 mg/dL (calc) (ref <130)
Total CHOL/HDL Ratio: 2.8 (calc) (ref <5.0)
Triglycerides: 87 mg/dL (ref <150)

## 2019-07-21 MED ORDER — CELECOXIB 200 MG PO CAPS: ORAL_CAPSULE | ORAL | 11 refills | Status: DC

## 2019-07-21 MED ORDER — IRBESARTAN 150 MG PO TABS: 150.0000 mg | ORAL_TABLET | Freq: Every day | ORAL | 3 refills | Status: DC

## 2019-07-21 MED ORDER — AMLODIPINE BESYLATE 5 MG PO TABS: 5.0000 mg | ORAL_TABLET | Freq: Every day | ORAL | 3 refills | Status: DC

## 2019-07-21 NOTE — Assessment & Plan Note (Signed)
Increasing back and knee pain, restarting Celebrex.

## 2019-07-21 NOTE — Assessment & Plan Note (Signed)
Currently on CPAP with good compliance measures per his report. No further intervention needed.

## 2019-07-21 NOTE — Progress Notes (Signed)
Subjective:    CC: Annual Physical Exam  HPI:  This patient is here for their annual physical  I reviewed the past medical history, family history, social history, surgical history, and allergies today and no changes were needed.  Please see the problem list section below in epic for further details.  Past Medical History: Past Medical History:  Diagnosis Date  . Hypertension   . Sleep apnea    Past Surgical History: Past Surgical History:  Procedure Laterality Date  . HERNIA REPAIR     hiatal hernia   Social History: Social History   Socioeconomic History  . Marital status: Married    Spouse name: Not on file  . Number of children: Not on file  . Years of education: Not on file  . Highest education level: Not on file  Occupational History  . Not on file  Tobacco Use  . Smoking status: Light Tobacco Smoker    Types: Cigars  . Smokeless tobacco: Never Used  Substance and Sexual Activity  . Alcohol use: Yes    Alcohol/week: 2.0 - 3.0 standard drinks    Types: 2 - 3 Cans of beer per week    Comment: weekly  . Drug use: Never  . Sexual activity: Yes  Other Topics Concern  . Not on file  Social History Narrative  . Not on file   Social Determinants of Health   Financial Resource Strain:   . Difficulty of Paying Living Expenses:   Food Insecurity:   . Worried About Programme researcher, broadcasting/film/video in the Last Year:   . Barista in the Last Year:   Transportation Needs:   . Freight forwarder (Medical):   Marland Kitchen Lack of Transportation (Non-Medical):   Physical Activity:   . Days of Exercise per Week:   . Minutes of Exercise per Session:   Stress:   . Feeling of Stress :   Social Connections:   . Frequency of Communication with Friends and Family:   . Frequency of Social Gatherings with Friends and Family:   . Attends Religious Services:   . Active Member of Clubs or Organizations:   . Attends Banker Meetings:   Marland Kitchen Marital Status:    Family  History: Family History  Problem Relation Age of Onset  . Hypertension Mother   . Hypertension Sister   . Diabetes Sister   . Hypertension Brother   . Diabetes Maternal Aunt    Allergies: No Known Allergies Medications: See med rec.  Review of Systems: No headache, visual changes, nausea, vomiting, diarrhea, constipation, dizziness, abdominal pain, skin rash, fevers, chills, night sweats, swollen lymph nodes, weight loss, chest pain, body aches, joint swelling, muscle aches, shortness of breath, mood changes, visual or auditory hallucinations.  Objective:    General: Well Developed, well nourished, and in no acute distress.  Neuro: Alert and oriented x3, extra-ocular muscles intact, sensation grossly intact. Cranial nerves II through XII are intact, motor, sensory, and coordinative functions are all intact. HEENT: Normocephalic, atraumatic, pupils equal round reactive to light, neck supple, no masses, no lymphadenopathy, thyroid nonpalpable. Oropharynx, nasopharynx, external ear canals are unremarkable. Skin: Warm and dry, no rashes noted.  Cardiac: Regular rate and rhythm, no murmurs rubs or gallops.  Respiratory: Clear to auscultation bilaterally. Not using accessory muscles, speaking in full sentences.  Abdominal: Soft, nontender, nondistended, positive bowel sounds, no masses, no organomegaly.  Musculoskeletal: Shoulder, elbow, wrist, hip, knee, ankle stable, and with full range of motion.  Impression and Recommendations:    The patient was counselled, risk factors were discussed, anticipatory guidance given.  Annual physical exam Annual physical as above, went over his labs today. He is up-to-date on screening measures.  Obstructive sleep apnea Currently on CPAP with good compliance measures per his report. No further intervention needed.  Prediabetes All of her needs to continue to work on weight loss aggressively.  Primary osteoarthritis of both knees Increasing back  and knee pain, restarting Celebrex.  Essential hypertension, benign Blood pressure is uncontrolled, we are going to overall his regimen. Discontinue losartan, hydrochlorothiazide, he really desires to avoid diuretics as he voids through the night. We are going to use irbesartan 150 mg daily as well as amlodipine 5 mg daily, I will print these prescriptions out as he would like to get these through his [place]. He will also touch base with me again in 2 weeks with some blood pressure readings and for dose titration.  Depression, major, single episode, moderate ([place]) Currently on Effexor, this is managed by his [place] provider, I would certainly recommend he go up to 150 mg.   ___________________________________________ Gwen Her. Dianah Field, M.D., ABFM., CAQSM. Primary Care and Sports Medicine [place] [facility]  Adjunct Professor of [employer]

## 2019-07-21 NOTE — Assessment & Plan Note (Addendum)
Currently on Effexor, this is managed by his [place] provider, I would certainly recommend he go up to 150 mg.

## 2019-07-21 NOTE — Assessment & Plan Note (Signed)
All of her needs to continue to work on weight loss aggressively.

## 2019-07-21 NOTE — Assessment & Plan Note (Signed)
Annual physical as above, went over his labs today. He is up-to-date on screening measures.

## 2019-07-21 NOTE — Assessment & Plan Note (Signed)
Blood pressure is uncontrolled, we are going to overall his regimen. Discontinue losartan, hydrochlorothiazide, he really desires to avoid diuretics as he voids through the night. We are going to use irbesartan 150 mg daily as well as amlodipine 5 mg daily, I will print these prescriptions out as he would like to get these through his [place] doctor. He will also touch base with me again in 2 weeks with some blood pressure readings and for dose titration.

## 2019-07-24 ENCOUNTER — Telehealth: Payer: Self-pay | Admitting: Sports Medicine

## 2019-07-24 NOTE — Telephone Encounter (Signed)
Received fax for PA on Celecoxib 200 mg sent through cover my meds and received authorization.   CaseId: [id number] Valid: 06/24/2019 - 07/23/2020 - CF

## 2019-08-04 ENCOUNTER — Telehealth (INDEPENDENT_AMBULATORY_CARE_PROVIDER_SITE_OTHER): Payer: BC Managed Care – PPO | Admitting: Sports Medicine

## 2019-08-04 ENCOUNTER — Encounter: Payer: Self-pay | Admitting: Sports Medicine

## 2019-08-04 DIAGNOSIS — I10 Essential (primary) hypertension: Secondary | ICD-10-CM | POA: Diagnosis not present

## 2019-08-04 MED ORDER — IRBESARTAN 300 MG PO TABS: 300.0000 mg | ORAL_TABLET | Freq: Every day | ORAL | 3 refills | Status: AC

## 2019-08-04 NOTE — Progress Notes (Signed)
   Virtual Visit via WebEx/MyChart   I connected with  Jovahn Breit  on 08/04/19 via WebEx/MyChart/Doximity Video and verified that I am speaking with the correct person using two identifiers.   I discussed the limitations, risks, security and privacy concerns of performing an evaluation and management service by WebEx/MyChart/Doximity Video, including the higher likelihood of inaccurate diagnosis and treatment, and the availability of in person appointments.  We also discussed the likely need of an additional face to face encounter for complete and high quality delivery of care.  I also discussed with the patient that there may be a patient responsible charge related to this service. The patient expressed understanding and wishes to proceed.  Provider location is either at home or medical facility. Patient location is at their home, different from provider location. People involved in care of the patient during this telehealth encounter were myself, my nurse/medical assistant, and my front office/scheduling team member.  Review of Systems: No fevers, chills, night sweats, weight loss, chest pain, or shortness of breath.   Objective Findings:    General: Speaking full sentences, no audible heavy breathing.  Sounds alert and appropriately interactive.  Appears well.  Face symmetric.  Extraocular movements intact.  Pupils equal and round.  No nasal flaring or accessory muscle use visualized.  Independent interpretation of tests performed by another provider:   None.  Brief History, Exam, Impression, and Recommendations:    Essential hypertension, benign All of her returns, blood pressures have improved considerably, his ultimate goal was to get off of diuretics which caused intolerable polyuria. Currently he is on a irbesartan 150 and amlodipine 5. Only minimal lower extremity edema that is tolerable. Blood pressures have been running around the 140s systolic over 90s diastolic, his reading  today was almost 130/90. Increasing irbesartan to 300 mg daily, recheck in 2 weeks, if still elevated we will go up to 10 mg of amlodipine.   I discussed the above assessment and treatment plan with the patient. The patient was provided an opportunity to ask questions and all were answered. The patient agreed with the plan and demonstrated an understanding of the instructions.   The patient was advised to call back or seek an in-person evaluation if the symptoms worsen or if the condition fails to improve as anticipated.   I provided 30 minutes of face to face and non-face-to-face time during this encounter date, time was needed to gather information, review chart, records, communicate/coordinate with staff remotely, as well as complete documentation.   ___________________________________________ Ihor Austin. Benjamin Stain, M.D., ABFM., CAQSM. Primary Care and Sports Medicine [place] [facility]  Adjunct Instructor of Family Medicine  [employer]

## 2019-08-04 NOTE — Assessment & Plan Note (Signed)
All of her returns, blood pressures have improved considerably, his ultimate goal was to get off of diuretics which caused intolerable polyuria. Currently he is on a irbesartan 150 and amlodipine 5. Only minimal lower extremity edema that is tolerable. Blood pressures have been running around the 140s systolic over 90s diastolic, his reading today was almost 130/90. Increasing irbesartan to 300 mg daily, recheck in 2 weeks, if still elevated we will go up to 10 mg of amlodipine.

## 2019-09-15 DIAGNOSIS — R7309 Other abnormal glucose: Secondary | ICD-10-CM | POA: Diagnosis not present

## 2019-09-15 DIAGNOSIS — R9431 Abnormal electrocardiogram [ECG] [EKG]: Secondary | ICD-10-CM | POA: Diagnosis not present

## 2019-09-15 DIAGNOSIS — D72829 Elevated white blood cell count, unspecified: Secondary | ICD-10-CM | POA: Diagnosis not present

## 2019-09-15 DIAGNOSIS — R5383 Other fatigue: Secondary | ICD-10-CM | POA: Diagnosis not present

## 2019-09-15 DIAGNOSIS — I10 Essential (primary) hypertension: Secondary | ICD-10-CM | POA: Diagnosis not present

## 2019-09-16 DIAGNOSIS — Z1211 Encounter for screening for malignant neoplasm of colon: Secondary | ICD-10-CM | POA: Diagnosis not present

## 2019-09-16 DIAGNOSIS — K5730 Diverticulosis of large intestine without perforation or abscess without bleeding: Secondary | ICD-10-CM | POA: Diagnosis not present

## 2019-09-17 DIAGNOSIS — G43909 Migraine, unspecified, not intractable, without status migrainosus: Secondary | ICD-10-CM | POA: Diagnosis not present

## 2019-09-17 DIAGNOSIS — Z23 Encounter for immunization: Secondary | ICD-10-CM | POA: Diagnosis not present

## 2019-09-17 LAB — HM COLONOSCOPY

## 2019-09-30 DIAGNOSIS — R350 Frequency of micturition: Secondary | ICD-10-CM | POA: Diagnosis not present

## 2019-09-30 DIAGNOSIS — D72829 Elevated white blood cell count, unspecified: Secondary | ICD-10-CM | POA: Diagnosis not present

## 2019-10-23 DIAGNOSIS — L720 Epidermal cyst: Secondary | ICD-10-CM | POA: Diagnosis not present

## 2019-10-23 DIAGNOSIS — Z0189 Encounter for other specified special examinations: Secondary | ICD-10-CM | POA: Diagnosis not present

## 2019-10-23 DIAGNOSIS — L209 Atopic dermatitis, unspecified: Secondary | ICD-10-CM | POA: Diagnosis not present

## 2019-10-23 DIAGNOSIS — Z79899 Other long term (current) drug therapy: Secondary | ICD-10-CM | POA: Diagnosis not present

## 2019-11-07 ENCOUNTER — Other Ambulatory Visit: Payer: Self-pay | Admitting: Sports Medicine

## 2019-11-07 DIAGNOSIS — I10 Essential (primary) hypertension: Secondary | ICD-10-CM

## 2019-11-07 MED ORDER — AMLODIPINE BESYLATE 5 MG PO TABS: 5.0000 mg | ORAL_TABLET | Freq: Every day | ORAL | 3 refills | Status: DC

## 2019-11-07 MED ORDER — AMLODIPINE BESYLATE 5 MG PO TABS: 5.0000 mg | ORAL_TABLET | Freq: Every day | ORAL | 3 refills | Status: AC

## 2019-11-10 DIAGNOSIS — I10 Essential (primary) hypertension: Secondary | ICD-10-CM | POA: Diagnosis not present

## 2019-11-10 DIAGNOSIS — D72829 Elevated white blood cell count, unspecified: Secondary | ICD-10-CM | POA: Diagnosis not present

## 2019-11-10 DIAGNOSIS — F329 Major depressive disorder, single episode, unspecified: Secondary | ICD-10-CM | POA: Diagnosis not present

## 2019-11-10 DIAGNOSIS — G43909 Migraine, unspecified, not intractable, without status migrainosus: Secondary | ICD-10-CM | POA: Diagnosis not present

## 2019-11-13 DIAGNOSIS — Z803 Family history of malignant neoplasm of breast: Secondary | ICD-10-CM | POA: Diagnosis not present

## 2019-11-13 DIAGNOSIS — R05 Cough: Secondary | ICD-10-CM | POA: Diagnosis not present

## 2019-11-13 DIAGNOSIS — C9110 Chronic lymphocytic leukemia of B-cell type not having achieved remission: Secondary | ICD-10-CM | POA: Diagnosis not present

## 2019-11-13 DIAGNOSIS — F17290 Nicotine dependence, other tobacco product, uncomplicated: Secondary | ICD-10-CM | POA: Diagnosis not present

## 2019-12-10 DIAGNOSIS — Z23 Encounter for immunization: Secondary | ICD-10-CM | POA: Diagnosis not present

## 2020-01-19 DIAGNOSIS — L209 Atopic dermatitis, unspecified: Secondary | ICD-10-CM | POA: Diagnosis not present

## 2020-01-19 DIAGNOSIS — L720 Epidermal cyst: Secondary | ICD-10-CM | POA: Diagnosis not present

## 2020-01-20 DIAGNOSIS — G43919 Migraine, unspecified, intractable, without status migrainosus: Secondary | ICD-10-CM | POA: Diagnosis not present

## 2020-01-27 DIAGNOSIS — H16041 Marginal corneal ulcer, right eye: Secondary | ICD-10-CM | POA: Diagnosis not present

## 2020-01-27 DIAGNOSIS — L720 Epidermal cyst: Secondary | ICD-10-CM | POA: Diagnosis not present

## 2020-02-10 DIAGNOSIS — H16041 Marginal corneal ulcer, right eye: Secondary | ICD-10-CM | POA: Diagnosis not present

## 2020-02-12 DIAGNOSIS — Z23 Encounter for immunization: Secondary | ICD-10-CM | POA: Diagnosis not present

## 2020-02-12 DIAGNOSIS — C9110 Chronic lymphocytic leukemia of B-cell type not having achieved remission: Secondary | ICD-10-CM | POA: Diagnosis not present

## 2020-02-27 DIAGNOSIS — L720 Epidermal cyst: Secondary | ICD-10-CM | POA: Diagnosis not present

## 2020-04-26 DIAGNOSIS — Z20822 Contact with and (suspected) exposure to covid-19: Secondary | ICD-10-CM | POA: Diagnosis not present

## 2020-05-13 DIAGNOSIS — C9110 Chronic lymphocytic leukemia of B-cell type not having achieved remission: Secondary | ICD-10-CM | POA: Diagnosis not present

## 2020-05-21 DIAGNOSIS — G43709 Chronic migraine without aura, not intractable, without status migrainosus: Secondary | ICD-10-CM | POA: Diagnosis not present

## 2020-08-12 DIAGNOSIS — C9110 Chronic lymphocytic leukemia of B-cell type not having achieved remission: Secondary | ICD-10-CM | POA: Diagnosis not present

## 2020-11-10 DIAGNOSIS — C9110 Chronic lymphocytic leukemia of B-cell type not having achieved remission: Secondary | ICD-10-CM | POA: Diagnosis not present

## 2020-11-11 DIAGNOSIS — C9110 Chronic lymphocytic leukemia of B-cell type not having achieved remission: Secondary | ICD-10-CM | POA: Diagnosis not present

## 2020-12-06 DIAGNOSIS — R059 Cough, unspecified: Secondary | ICD-10-CM | POA: Diagnosis not present

## 2021-02-03 DIAGNOSIS — H524 Presbyopia: Secondary | ICD-10-CM | POA: Diagnosis not present

## 2021-02-03 DIAGNOSIS — H35033 Hypertensive retinopathy, bilateral: Secondary | ICD-10-CM | POA: Diagnosis not present

## 2021-05-06 DIAGNOSIS — C9110 Chronic lymphocytic leukemia of B-cell type not having achieved remission: Secondary | ICD-10-CM | POA: Diagnosis not present

## 2021-05-10 DIAGNOSIS — R7303 Prediabetes: Secondary | ICD-10-CM | POA: Diagnosis not present

## 2021-05-10 DIAGNOSIS — C9110 Chronic lymphocytic leukemia of B-cell type not having achieved remission: Secondary | ICD-10-CM | POA: Diagnosis not present

## 2021-05-10 DIAGNOSIS — I10 Essential (primary) hypertension: Secondary | ICD-10-CM | POA: Diagnosis not present

## 2021-05-20 DIAGNOSIS — H04123 Dry eye syndrome of bilateral lacrimal glands: Secondary | ICD-10-CM | POA: Diagnosis not present

## 2021-05-20 DIAGNOSIS — H524 Presbyopia: Secondary | ICD-10-CM | POA: Diagnosis not present

## 2021-05-20 DIAGNOSIS — H2513 Age-related nuclear cataract, bilateral: Secondary | ICD-10-CM | POA: Diagnosis not present

## 2021-05-20 DIAGNOSIS — H35033 Hypertensive retinopathy, bilateral: Secondary | ICD-10-CM | POA: Diagnosis not present

## 2021-06-14 DIAGNOSIS — H524 Presbyopia: Secondary | ICD-10-CM | POA: Diagnosis not present

## 2021-06-14 DIAGNOSIS — H2513 Age-related nuclear cataract, bilateral: Secondary | ICD-10-CM | POA: Diagnosis not present

## 2021-07-05 DIAGNOSIS — R7303 Prediabetes: Secondary | ICD-10-CM | POA: Diagnosis not present

## 2021-07-05 DIAGNOSIS — I10 Essential (primary) hypertension: Secondary | ICD-10-CM | POA: Diagnosis not present

## 2021-07-05 DIAGNOSIS — G43909 Migraine, unspecified, not intractable, without status migrainosus: Secondary | ICD-10-CM | POA: Diagnosis not present

## 2021-07-05 DIAGNOSIS — C9110 Chronic lymphocytic leukemia of B-cell type not having achieved remission: Secondary | ICD-10-CM | POA: Diagnosis not present

## 2021-07-05 DIAGNOSIS — L2089 Other atopic dermatitis: Secondary | ICD-10-CM | POA: Diagnosis not present

## 2021-07-21 DIAGNOSIS — M2011 Hallux valgus (acquired), right foot: Secondary | ICD-10-CM | POA: Diagnosis not present

## 2021-07-21 DIAGNOSIS — E119 Type 2 diabetes mellitus without complications: Secondary | ICD-10-CM | POA: Diagnosis not present

## 2021-07-21 DIAGNOSIS — I10 Essential (primary) hypertension: Secondary | ICD-10-CM | POA: Diagnosis not present

## 2021-07-21 DIAGNOSIS — M2012 Hallux valgus (acquired), left foot: Secondary | ICD-10-CM | POA: Diagnosis not present

## 2021-07-21 DIAGNOSIS — M19072 Primary osteoarthritis, left ankle and foot: Secondary | ICD-10-CM | POA: Diagnosis not present

## 2021-07-21 DIAGNOSIS — G4730 Sleep apnea, unspecified: Secondary | ICD-10-CM | POA: Diagnosis not present

## 2021-07-21 DIAGNOSIS — M19071 Primary osteoarthritis, right ankle and foot: Secondary | ICD-10-CM | POA: Diagnosis not present

## 2021-07-21 DIAGNOSIS — M25569 Pain in unspecified knee: Secondary | ICD-10-CM | POA: Diagnosis not present

## 2021-08-26 DIAGNOSIS — G43719 Chronic migraine without aura, intractable, without status migrainosus: Secondary | ICD-10-CM | POA: Diagnosis not present

## 2021-09-13 DIAGNOSIS — M722 Plantar fascial fibromatosis: Secondary | ICD-10-CM | POA: Diagnosis not present

## 2021-10-19 DIAGNOSIS — C9110 Chronic lymphocytic leukemia of B-cell type not having achieved remission: Secondary | ICD-10-CM | POA: Diagnosis not present

## 2021-10-19 DIAGNOSIS — I10 Essential (primary) hypertension: Secondary | ICD-10-CM | POA: Diagnosis not present

## 2021-10-19 DIAGNOSIS — Y93G2 Activity, grilling and smoking food: Secondary | ICD-10-CM | POA: Diagnosis not present

## 2021-10-19 DIAGNOSIS — G4730 Sleep apnea, unspecified: Secondary | ICD-10-CM | POA: Diagnosis not present

## 2021-10-19 DIAGNOSIS — T22211A Burn of second degree of right forearm, initial encounter: Secondary | ICD-10-CM | POA: Diagnosis not present

## 2021-10-19 DIAGNOSIS — E119 Type 2 diabetes mellitus without complications: Secondary | ICD-10-CM | POA: Diagnosis not present

## 2021-10-19 DIAGNOSIS — X19XXXA Contact with other heat and hot substances, initial encounter: Secondary | ICD-10-CM | POA: Diagnosis not present

## 2021-11-03 DIAGNOSIS — Z09 Encounter for follow-up examination after completed treatment for conditions other than malignant neoplasm: Secondary | ICD-10-CM | POA: Diagnosis not present

## 2021-11-03 DIAGNOSIS — C9110 Chronic lymphocytic leukemia of B-cell type not having achieved remission: Secondary | ICD-10-CM | POA: Diagnosis not present

## 2021-11-07 ENCOUNTER — Encounter: Payer: Self-pay | Admitting: Sports Medicine

## 2021-11-07 ENCOUNTER — Ambulatory Visit: Payer: BC Managed Care – PPO | Admitting: Sports Medicine

## 2021-11-07 VITALS — BP 133/82 | HR 60 | Ht 71.0 in | Wt 272.0 lb

## 2021-11-07 DIAGNOSIS — E6609 Other obesity due to excess calories: Secondary | ICD-10-CM | POA: Diagnosis not present

## 2021-11-07 DIAGNOSIS — Z6830 Body mass index (BMI) 30.0-30.9, adult: Secondary | ICD-10-CM | POA: Diagnosis not present

## 2021-11-07 DIAGNOSIS — R7303 Prediabetes: Secondary | ICD-10-CM | POA: Diagnosis not present

## 2021-11-07 DIAGNOSIS — N139 Obstructive and reflux uropathy, unspecified: Secondary | ICD-10-CM | POA: Diagnosis not present

## 2021-11-07 MED ORDER — PHENTERMINE HCL 37.5 MG PO TABS: ORAL_TABLET | ORAL | 0 refills | Status: DC

## 2021-11-07 MED ORDER — WEGOVY 0.25 MG/0.5ML ~~LOC~~ SOAJ: 0.2500 mg | SUBCUTANEOUS | 0 refills | Status: DC

## 2021-11-07 NOTE — Assessment & Plan Note (Signed)
This is a pleasant 61 year old male, he is obese and is interested in losing weight. We will start phentermine, and also Wegovy, he will be part of a multidisciplinary weight loss program with calorie counting and an exercise prescription. For insurance company purposes he will not be on phentermine and Wegovy at the same time.

## 2021-11-07 NOTE — Progress Notes (Signed)
    Procedures performed today:    None.  Independent interpretation of notes and tests performed by another provider:   None.  Brief History, Exam, Impression, and Recommendations:    Obesity This is a pleasant 61 year old male, he is obese and is interested in losing weight. We will start phentermine, and also Wegovy, he will be part of a multidisciplinary weight loss program with calorie counting and an exercise prescription. For insurance company purposes he will not be on phentermine and Wegovy at the same time.    ____________________________________________ Jeffrey Burnett. Benjamin Stain, M.D., ABFM., CAQSM., AME. Primary Care and Sports Medicine [facility] [facility]  Adjunct Professor of Family Medicine  [place] [employer] [facility] [employer]  Restaurant manager, fast food

## 2021-11-08 DIAGNOSIS — R7303 Prediabetes: Secondary | ICD-10-CM | POA: Diagnosis not present

## 2021-11-08 DIAGNOSIS — E7800 Pure hypercholesterolemia, unspecified: Secondary | ICD-10-CM | POA: Diagnosis not present

## 2021-11-08 DIAGNOSIS — N139 Obstructive and reflux uropathy, unspecified: Secondary | ICD-10-CM | POA: Diagnosis not present

## 2021-11-08 DIAGNOSIS — I10 Essential (primary) hypertension: Secondary | ICD-10-CM | POA: Diagnosis not present

## 2021-11-10 LAB — HEMOGLOBIN A1C
Hgb A1c MFr Bld: 6.2 % of total Hgb — ABNORMAL HIGH (ref <5.7)
Mean Plasma Glucose: 131 mg/dL
eAG (mmol/L): 7.3 mmol/L

## 2021-11-10 LAB — COMPLETE METABOLIC PANEL WITH GFR
AG Ratio: 1.4 (calc) (ref 1.0–2.5)
ALT: 28 U/L (ref 9–46)
AST: 25 U/L (ref 10–35)
Albumin: 4.2 g/dL (ref 3.6–5.1)
Alkaline phosphatase (APISO): 71 U/L (ref 35–144)
BUN: 13 mg/dL (ref 7–25)
CO2: 22 mmol/L (ref 20–32)
Calcium: 8.9 mg/dL (ref 8.6–10.3)
Chloride: 109 mmol/L (ref 98–110)
Creat: 1.19 mg/dL (ref 0.70–1.35)
Globulin: 2.9 g/dL (calc) (ref 1.9–3.7)
Glucose, Bld: 113 mg/dL — ABNORMAL HIGH (ref 65–99)
Potassium: 4.8 mmol/L (ref 3.5–5.3)
Sodium: 142 mmol/L (ref 135–146)
Total Bilirubin: 0.4 mg/dL (ref 0.2–1.2)
Total Protein: 7.1 g/dL (ref 6.1–8.1)
eGFR: 70 mL/min/{1.73_m2} (ref >=60)

## 2021-11-10 LAB — PSA, TOTAL AND FREE
PSA, % Free: 33 % (calc) (ref >25)
PSA, Free: 0.2 ng/mL
PSA, Total: 0.6 ng/mL (ref <=4.0)

## 2021-11-10 LAB — CBC
HCT: 41.3 % (ref 38.5–50.0)
Hemoglobin: 13.5 g/dL (ref 13.2–17.1)
MCH: 29 pg (ref 27.0–33.0)
MCHC: 32.7 g/dL (ref 32.0–36.0)
MCV: 88.8 fL (ref 80.0–100.0)
MPV: 10.2 fL (ref 7.5–12.5)
Platelets: 240 10*3/uL (ref 140–400)
RBC: 4.65 10*6/uL (ref 4.20–5.80)
RDW: 12.4 % (ref 11.0–15.0)
WBC: 13.3 10*3/uL — ABNORMAL HIGH (ref 3.8–10.8)

## 2021-11-10 LAB — LIPID PANEL
Cholesterol: 177 mg/dL (ref <200)
HDL: 72 mg/dL (ref >=40)
LDL Cholesterol (Calc): 92 mg/dL (calc)
Non-HDL Cholesterol (Calc): 105 mg/dL (calc) (ref <130)
Total CHOL/HDL Ratio: 2.5 (calc) (ref <5.0)
Triglycerides: 50 mg/dL (ref <150)

## 2021-11-10 LAB — TSH: TSH: 2.46 mIU/L (ref 0.40–4.50)

## 2021-11-24 ENCOUNTER — Telehealth: Payer: Self-pay

## 2021-11-24 NOTE — Telephone Encounter (Signed)
Initiated Prior authorization XYI:AXKPVV 0.25MG /0.5ML auto-injectors Via: Covermymeds Case/Key:[id number] Status: approved  as of 11/24/21 Reason:Start Date:10/25/2021;Coverage End Date:06/22/2022; Notified Pt via:Pt does not have  Mychart, callled pt to make aware approval

## 2021-12-08 ENCOUNTER — Ambulatory Visit: Payer: BC Managed Care – PPO | Admitting: Sports Medicine

## 2021-12-08 ENCOUNTER — Encounter: Payer: Self-pay | Admitting: Sports Medicine

## 2021-12-08 DIAGNOSIS — E6609 Other obesity due to excess calories: Secondary | ICD-10-CM

## 2021-12-08 DIAGNOSIS — Z6830 Body mass index (BMI) 30.0-30.9, adult: Secondary | ICD-10-CM | POA: Diagnosis not present

## 2021-12-08 MED ORDER — PHENTERMINE HCL 37.5 MG PO TABS: ORAL_TABLET | ORAL | 0 refills | Status: DC

## 2021-12-08 NOTE — Assessment & Plan Note (Signed)
13 pound weight loss after the first month on phentermine. No side effects with the exception of xerostomia. Refilling medication. We also started [facility], it did get approved but as is typical he is not able to find the initial dosage. He will think about doing the boot leg formulation until he can get to a dose where he can actually just use the [facility] pens. He will let me know if he would like me to call this in. Return for a weight check in a month.

## 2021-12-08 NOTE — Progress Notes (Signed)
    Procedures performed today:    None.  Independent interpretation of notes and tests performed by another provider:   None.  Brief History, Exam, Impression, and Recommendations:    Obesity 13 pound weight loss after the first month on phentermine. No side effects with the exception of xerostomia. Refilling medication. We also started [facility], it did get approved but as is typical he is not able to find the initial dosage. He will think about doing the boot leg formulation until he can get to a dose where he can actually just use the [facility] pens. He will let me know if he would like me to call this in. Return for a weight check in a month.  Chronic process not at goal with pharmacologic intervention  ____________________________________________ Ihor Austin. Benjamin Stain, M.D., ABFM., CAQSM., AME. Primary Care and Sports Medicine [place] [facility]  Adjunct Professor of Family Medicine  [place] [employer] [facility] [employer]  Restaurant manager, fast food

## 2021-12-09 ENCOUNTER — Telehealth: Payer: Self-pay

## 2021-12-09 NOTE — Telephone Encounter (Signed)
Patient's spouse called to report that [facility] [place] has victoza and trulicity.

## 2021-12-09 NOTE — Telephone Encounter (Signed)
This is good to know however Jeffrey Burnett does not have diabetes, he is prediabetic so those to want to get approved.  Still awaiting information on Wegovy.  The compounded formulation is still available in [place] if he would like.

## 2021-12-29 DIAGNOSIS — G43909 Migraine, unspecified, not intractable, without status migrainosus: Secondary | ICD-10-CM | POA: Diagnosis not present

## 2022-01-06 ENCOUNTER — Encounter: Payer: Self-pay | Admitting: Sports Medicine

## 2022-01-06 ENCOUNTER — Ambulatory Visit: Payer: BC Managed Care – PPO | Admitting: Sports Medicine

## 2022-01-06 VITALS — BP 129/74 | HR 66 | Ht 71.0 in | Wt 249.0 lb

## 2022-01-06 DIAGNOSIS — Z23 Encounter for immunization: Secondary | ICD-10-CM

## 2022-01-06 DIAGNOSIS — E6609 Other obesity due to excess calories: Secondary | ICD-10-CM | POA: Diagnosis not present

## 2022-01-06 DIAGNOSIS — E66811 Obesity, class 1: Secondary | ICD-10-CM

## 2022-01-06 DIAGNOSIS — Z6830 Body mass index (BMI) 30.0-30.9, adult: Secondary | ICD-10-CM

## 2022-01-06 MED ORDER — PHENTERMINE HCL 37.5 MG PO TABS: ORAL_TABLET | ORAL | 0 refills | Status: DC

## 2022-01-06 MED ORDER — WEGOVY 1.7 MG/0.75ML ~~LOC~~ SOAJ: 1.7000 mg | SUBCUTANEOUS | 0 refills | Status: DC

## 2022-01-06 MED ORDER — WEGOVY 2.4 MG/0.75ML ~~LOC~~ SOAJ: 2.4000 mg | SUBCUTANEOUS | 11 refills | Status: DC

## 2022-01-06 MED ORDER — SEMAGLUTIDE (2 MG/DOSE) 8 MG/3ML ~~LOC~~ SOPN: PEN_INJECTOR | SUBCUTANEOUS | 3 refills | Status: DC

## 2022-01-06 MED ORDER — WEGOVY 1 MG/0.5ML ~~LOC~~ SOAJ: 1.0000 mg | SUBCUTANEOUS | 0 refills | Status: DC

## 2022-01-06 MED ORDER — WEGOVY 0.5 MG/0.5ML ~~LOC~~ SOAJ: 0.5000 mg | SUBCUTANEOUS | 0 refills | Status: DC

## 2022-01-06 NOTE — Progress Notes (Signed)
    Procedures performed today:    None.  Independent interpretation of notes and tests performed by another provider:   None.  Brief History, Exam, Impression, and Recommendations:    Obesity This pleasant 61 year old male returns, we started phentermine, we also got him approved for [facility] though he has had trouble finding the lower doses as is typical. He lost 13 pounds first month, 10 pounds second month, entering the third month, I am going to send in compounded semaglutide to [facility] in [place], this will allow him to start the lower doses of semaglutide, most going to give him written prescriptions for the next several doses of branded Wegovy to call around.  Chronic process not at goal with pharmacologic intervention  ____________________________________________ Ihor Austin. Benjamin Stain, M.D., ABFM., CAQSM., AME. Primary Care and Sports Medicine [place] [facility]  Adjunct Professor of Family Medicine  [place] [employer] [facility] [employer]  Restaurant manager, fast food

## 2022-01-06 NOTE — Assessment & Plan Note (Addendum)
This pleasant 61 year old male returns, we started phentermine, we also got him approved for [facility] though he has had trouble finding the lower doses as is typical. He lost 13 pounds first month, 10 pounds second month, entering the third month, I am going to send in compounded semaglutide to [facility] in [place], this will allow him to start the lower doses of semaglutide, most going to give him written prescriptions for the next several doses of branded Wegovy to call around.

## 2022-02-06 ENCOUNTER — Ambulatory Visit: Payer: BC Managed Care – PPO | Admitting: Sports Medicine

## 2022-02-06 ENCOUNTER — Encounter: Payer: Self-pay | Admitting: Sports Medicine

## 2022-02-06 DIAGNOSIS — E6609 Other obesity due to excess calories: Secondary | ICD-10-CM

## 2022-02-06 DIAGNOSIS — Z6830 Body mass index (BMI) 30.0-30.9, adult: Secondary | ICD-10-CM

## 2022-02-06 MED ORDER — PHENTERMINE HCL 37.5 MG PO TABS: 18.7500 mg | ORAL_TABLET | Freq: Every day | ORAL | 0 refills | Status: DC

## 2022-02-06 NOTE — Progress Notes (Signed)
    Procedures performed today:    None.  Independent interpretation of notes and tests performed by another provider:   None.  Brief History, Exam, Impression, and Recommendations:    Obesity Jeffrey Burnett returns, he is a pleasant 61 year old male, we did start phentermine and also got him approved for [facility], still unable to find the lower dosage. He did lose excellent weight with phentermine the first, second, and the most recent month. We also were able to get him compounded semaglutide, he paid about 200 bucks a month for this. It is highly effective, he will need to do another month of compounded semaglutide before being able to get the brand-name Wegovy from his pharmacy. Unfortunately he is starting to have some difficulty sleeping, sleep latency is okay but unable to maintain sleep, I do think we need to cut the phentermine in half because of this, if after 2 weeks if still having insomnia he will stop phentermine altogether. He is also taking metformin for prediabetes which I think he can stop since he is on semaglutide. Entering the fourth month.  Chronic process not at goal with pharmacologic intervention   ____________________________________________ Gwen Her. Dianah Field, M.D., ABFM., CAQSM., AME. Primary Care and Sports Medicine [facility]  Adjunct Professor of [place] [employer] [facility] of Medicine  Risk manager

## 2022-02-06 NOTE — Assessment & Plan Note (Addendum)
Stefano returns, he is a pleasant 61 year old male, we did start phentermine and also got him approved for [facility], still unable to find the lower dosage. He did lose excellent weight with phentermine the first, second, and the most recent month. We also were able to get him compounded semaglutide, he paid about 200 bucks a month for this. It is highly effective, he will need to do another month of compounded semaglutide before being able to get the brand-name Wegovy from his pharmacy. Unfortunately he is starting to have some difficulty sleeping, sleep latency is okay but unable to maintain sleep, I do think we need to cut the phentermine in half because of this, if after 2 weeks if still having insomnia he will stop phentermine altogether. He is also taking metformin for prediabetes which I think he can stop since he is on semaglutide. Entering the fourth month.

## 2022-02-17 DIAGNOSIS — Z23 Encounter for immunization: Secondary | ICD-10-CM | POA: Diagnosis not present

## 2022-02-28 ENCOUNTER — Ambulatory Visit: Payer: BC Managed Care – PPO | Admitting: Sports Medicine

## 2022-02-28 ENCOUNTER — Encounter: Payer: Self-pay | Admitting: Sports Medicine

## 2022-02-28 DIAGNOSIS — E6609 Other obesity due to excess calories: Secondary | ICD-10-CM | POA: Diagnosis not present

## 2022-02-28 DIAGNOSIS — Z6830 Body mass index (BMI) 30.0-30.9, adult: Secondary | ICD-10-CM | POA: Diagnosis not present

## 2022-02-28 NOTE — Progress Notes (Signed)
    Procedures performed today:    None.  Independent interpretation of notes and tests performed by another provider:   None.  Brief History, Exam, Impression, and Recommendations:    Obesity Good continued weight loss, he is going to starting Wegovy 1.7 branded, he will do this for a month and then go up to 2.4. We cut phentermine in half at the last visit due to insomnia, unfortunately insomnia is still there and he is having some urinary hesitancy. Discontinue phentermine altogether, he will stay off of it for 2 weeks and if notices improvements we will keep him off of it, if no improvement we can restart. We were entering the fifth month. Return to see me in 4 months.    ____________________________________________ Gwen Her. Dianah Field, M.D., ABFM., CAQSM., AME. Primary Care and Sports Medicine [place] [facility]  Adjunct Professor of [place] of [facility] of Medicine  Risk manager

## 2022-02-28 NOTE — Assessment & Plan Note (Signed)
Good continued weight loss, he is going to starting Wegovy 1.7 branded, he will do this for a month and then go up to 2.4. We cut phentermine in half at the last visit due to insomnia, unfortunately insomnia is still there and he is having some urinary hesitancy. Discontinue phentermine altogether, he will stay off of it for 2 weeks and if notices improvements we will keep him off of it, if no improvement we can restart. We were entering the fifth month. Return to see me in 4 months.

## 2022-03-03 ENCOUNTER — Ambulatory Visit: Payer: BC Managed Care – PPO | Admitting: Sports Medicine

## 2022-03-09 DIAGNOSIS — C9110 Chronic lymphocytic leukemia of B-cell type not having achieved remission: Secondary | ICD-10-CM | POA: Diagnosis not present

## 2022-03-09 DIAGNOSIS — D649 Anemia, unspecified: Secondary | ICD-10-CM | POA: Diagnosis not present

## 2022-03-22 ENCOUNTER — Other Ambulatory Visit: Payer: Self-pay

## 2022-03-22 DIAGNOSIS — E6609 Other obesity due to excess calories: Secondary | ICD-10-CM

## 2022-03-22 MED ORDER — SEMAGLUTIDE (2 MG/DOSE) 8 MG/3ML ~~LOC~~ SOPN: PEN_INJECTOR | SUBCUTANEOUS | 3 refills | Status: DC

## 2022-05-08 DIAGNOSIS — H52203 Unspecified astigmatism, bilateral: Secondary | ICD-10-CM | POA: Diagnosis not present

## 2022-05-08 DIAGNOSIS — H2513 Age-related nuclear cataract, bilateral: Secondary | ICD-10-CM | POA: Diagnosis not present

## 2022-05-08 DIAGNOSIS — H5213 Myopia, bilateral: Secondary | ICD-10-CM | POA: Diagnosis not present

## 2022-05-08 DIAGNOSIS — E1136 Type 2 diabetes mellitus with diabetic cataract: Secondary | ICD-10-CM | POA: Diagnosis not present

## 2022-05-17 DIAGNOSIS — H524 Presbyopia: Secondary | ICD-10-CM | POA: Diagnosis not present

## 2022-05-17 DIAGNOSIS — H52203 Unspecified astigmatism, bilateral: Secondary | ICD-10-CM | POA: Diagnosis not present

## 2022-05-17 DIAGNOSIS — H5213 Myopia, bilateral: Secondary | ICD-10-CM | POA: Diagnosis not present

## 2022-06-28 DIAGNOSIS — E785 Hyperlipidemia, unspecified: Secondary | ICD-10-CM | POA: Diagnosis not present

## 2022-06-28 DIAGNOSIS — M25561 Pain in right knee: Secondary | ICD-10-CM | POA: Diagnosis not present

## 2022-06-28 DIAGNOSIS — E119 Type 2 diabetes mellitus without complications: Secondary | ICD-10-CM | POA: Diagnosis not present

## 2022-06-28 DIAGNOSIS — D649 Anemia, unspecified: Secondary | ICD-10-CM | POA: Diagnosis not present

## 2022-06-28 DIAGNOSIS — C9110 Chronic lymphocytic leukemia of B-cell type not having achieved remission: Secondary | ICD-10-CM | POA: Diagnosis not present

## 2022-06-28 DIAGNOSIS — I10 Essential (primary) hypertension: Secondary | ICD-10-CM | POA: Diagnosis not present

## 2022-06-29 ENCOUNTER — Encounter: Payer: Self-pay | Admitting: Sports Medicine

## 2022-06-29 ENCOUNTER — Ambulatory Visit: Payer: BC Managed Care – PPO | Admitting: Sports Medicine

## 2022-06-29 VITALS — BP 120/76 | HR 70 | Ht 71.0 in | Wt 223.0 lb

## 2022-06-29 DIAGNOSIS — E6609 Other obesity due to excess calories: Secondary | ICD-10-CM

## 2022-06-29 DIAGNOSIS — Z6830 Body mass index (BMI) 30.0-30.9, adult: Secondary | ICD-10-CM

## 2022-06-29 DIAGNOSIS — N139 Obstructive and reflux uropathy, unspecified: Secondary | ICD-10-CM | POA: Insufficient documentation

## 2022-06-29 MED ORDER — TADALAFIL 5 MG PO TABS: 5.0000 mg | ORAL_TABLET | Freq: Every day | ORAL | 11 refills | Status: DC

## 2022-06-29 MED ORDER — SEMAGLUTIDE (2 MG/DOSE) 8 MG/3ML ~~LOC~~ SOPN: PEN_INJECTOR | SUBCUTANEOUS | 11 refills | Status: DC

## 2022-06-29 NOTE — Progress Notes (Signed)
    Procedures performed today:    None.  Independent interpretation of notes and tests performed by another provider:   None.  Brief History, Exam, Impression, and Recommendations:    Obesity Good continued weight loss, currently doing semaglutide 2.4 compounded. He did discontinue phentermine, he has not noticed any changes in his urination, but does desire to stay off of it, refilling compounded semaglutide, will continue this long-term.  Obstructive uropathy Jeffrey Burnett continues to complain of multiple episodes of nocturia, he tries to avoid drinking excessive fluids before bedtime. He does have hesitancy, frequency, dribbling. This is even off of phentermine. We discussed additional sleep hygiene including avoiding consuming beverages within 2 to 3 hours of bedtime, we will also add tadalafil 5 mg daily. I would like him to see me a MyChart message revisiting this in about 4 weeks.  Chronic process with exacerbation and pharmacologic intervention  ____________________________________________ Jeffrey Burnett, M.D., ABFM., CAQSM., AME. Primary Care and Sports Medicine [place] [facility]  Adjunct Professor of [place] [employer] [facility] of Medicine  Risk manager

## 2022-06-29 NOTE — Assessment & Plan Note (Signed)
Good continued weight loss, currently doing semaglutide 2.4 compounded. He did discontinue phentermine, he has not noticed any changes in his urination, but does desire to stay off of it, refilling compounded semaglutide, will continue this long-term.

## 2022-06-29 NOTE — Assessment & Plan Note (Signed)
Cornell continues to complain of multiple episodes of nocturia, he tries to avoid drinking excessive fluids before bedtime. He does have hesitancy, frequency, dribbling. This is even off of phentermine. We discussed additional sleep hygiene including avoiding consuming beverages within 2 to 3 hours of bedtime, we will also add tadalafil 5 mg daily. I would like him to see me a MyChart message revisiting this in about 4 weeks.

## 2022-07-06 DIAGNOSIS — D649 Anemia, unspecified: Secondary | ICD-10-CM | POA: Diagnosis not present

## 2022-07-06 DIAGNOSIS — C9110 Chronic lymphocytic leukemia of B-cell type not having achieved remission: Secondary | ICD-10-CM | POA: Diagnosis not present

## 2022-07-13 DIAGNOSIS — I10 Essential (primary) hypertension: Secondary | ICD-10-CM | POA: Diagnosis not present

## 2022-07-13 DIAGNOSIS — E785 Hyperlipidemia, unspecified: Secondary | ICD-10-CM | POA: Diagnosis not present

## 2022-07-13 DIAGNOSIS — E119 Type 2 diabetes mellitus without complications: Secondary | ICD-10-CM | POA: Diagnosis not present

## 2022-07-13 DIAGNOSIS — Z23 Encounter for immunization: Secondary | ICD-10-CM | POA: Diagnosis not present

## 2022-07-13 DIAGNOSIS — N400 Enlarged prostate without lower urinary tract symptoms: Secondary | ICD-10-CM | POA: Diagnosis not present

## 2022-09-27 ENCOUNTER — Telehealth: Payer: Self-pay | Admitting: Sports Medicine

## 2022-09-27 DIAGNOSIS — E6609 Other obesity due to excess calories: Secondary | ICD-10-CM

## 2022-09-27 MED ORDER — SEMAGLUTIDE (2 MG/DOSE) 8 MG/3ML ~~LOC~~ SOPN: PEN_INJECTOR | SUBCUTANEOUS | 11 refills | Status: DC

## 2022-09-27 NOTE — Telephone Encounter (Signed)
Med refill sent

## 2022-09-27 NOTE — Telephone Encounter (Signed)
Spouse called. Patient is requesting a refill on his [facility]: Med Environmental education officer [employer]

## 2022-11-08 DIAGNOSIS — D630 Anemia in neoplastic disease: Secondary | ICD-10-CM | POA: Diagnosis not present

## 2022-11-08 DIAGNOSIS — C9110 Chronic lymphocytic leukemia of B-cell type not having achieved remission: Secondary | ICD-10-CM | POA: Diagnosis not present

## 2022-11-10 DIAGNOSIS — C9110 Chronic lymphocytic leukemia of B-cell type not having achieved remission: Secondary | ICD-10-CM | POA: Diagnosis not present

## 2022-12-28 ENCOUNTER — Encounter: Payer: Self-pay | Admitting: Sports Medicine

## 2022-12-28 ENCOUNTER — Ambulatory Visit: Payer: BC Managed Care – PPO | Admitting: Sports Medicine

## 2022-12-28 VITALS — BP 111/70 | HR 72 | Resp 20 | Ht 71.0 in | Wt 211.1 lb

## 2022-12-28 DIAGNOSIS — N139 Obstructive and reflux uropathy, unspecified: Secondary | ICD-10-CM | POA: Diagnosis not present

## 2022-12-28 DIAGNOSIS — E6609 Other obesity due to excess calories: Secondary | ICD-10-CM | POA: Diagnosis not present

## 2022-12-28 DIAGNOSIS — Z6830 Body mass index (BMI) 30.0-30.9, adult: Secondary | ICD-10-CM

## 2022-12-28 DIAGNOSIS — Z0000 Encounter for general adult medical examination without abnormal findings: Secondary | ICD-10-CM

## 2022-12-28 DIAGNOSIS — Z23 Encounter for immunization: Secondary | ICD-10-CM | POA: Diagnosis not present

## 2022-12-28 MED ORDER — MIRABEGRON ER 50 MG PO TB24: 50.0000 mg | ORAL_TABLET | Freq: Every day | ORAL | 11 refills | Status: DC

## 2022-12-28 NOTE — Progress Notes (Addendum)
    Procedures performed today:    None.  Independent interpretation of notes and tests performed by another provider:   None.  Brief History, Exam, Impression, and Recommendations:    Obstructive uropathy Multiple episodes of nocturia in spite of improved sleep hygiene. He does report hallucinations on tadalafil . We will discontinue this, switching to Myrbetriq . For insurance coverage purposes he has a contraindication to Vesicare, oxybutynin.  Obesity Additional 12 plan weight loss on compounded semaglutide , continues to lose weight, he does understand he really needs to increase his resistance training.  Update 08/23/2023: The compounding pharmacy is no longer offering semaglutide , we will need to switch to tirzepatide starting at the lower dose.  Annual physical exam Colonoscopy done in 2021, up-to-date now.  I spent 30 minutes of total time managing this patient today, this includes chart review, face to face, and non-face to face time.  ____________________________________________ Joselyn Nicely. Sandy Crumb, M.D., ABFM., CAQSM., AME. Primary Care and Sports Medicine  [facility]  Adjunct Professor of [facility] Medicine  [employer]  Restaurant manager, fast food

## 2022-12-28 NOTE — Assessment & Plan Note (Signed)
Multiple episodes of nocturia in spite of improved sleep hygiene. He does report hallucinations on tadalafil. We will discontinue this, switching to Myrbetriq. For insurance coverage purposes he has a contraindication to Vesicare, oxybutynin.

## 2022-12-28 NOTE — Assessment & Plan Note (Addendum)
 Additional 12 plan weight loss on compounded semaglutide , continues to lose weight, he does understand he really needs to increase his resistance training.  Update 08/23/2023: The compounding pharmacy is no longer offering semaglutide , we will need to switch to tirzepatide starting at the lower dose.

## 2022-12-28 NOTE — Assessment & Plan Note (Signed)
Colonoscopy done in 2021, up-to-date now.

## 2023-01-05 DIAGNOSIS — Z0189 Encounter for other specified special examinations: Secondary | ICD-10-CM | POA: Diagnosis not present

## 2023-01-11 DIAGNOSIS — E785 Hyperlipidemia, unspecified: Secondary | ICD-10-CM | POA: Diagnosis not present

## 2023-01-11 DIAGNOSIS — G4733 Obstructive sleep apnea (adult) (pediatric): Secondary | ICD-10-CM | POA: Diagnosis not present

## 2023-01-11 DIAGNOSIS — M76891 Other specified enthesopathies of right lower limb, excluding foot: Secondary | ICD-10-CM | POA: Diagnosis not present

## 2023-01-11 DIAGNOSIS — E119 Type 2 diabetes mellitus without complications: Secondary | ICD-10-CM | POA: Diagnosis not present

## 2023-01-11 DIAGNOSIS — M76892 Other specified enthesopathies of left lower limb, excluding foot: Secondary | ICD-10-CM | POA: Diagnosis not present

## 2023-01-11 DIAGNOSIS — M93961 Osteochondropathy, unspecified, right lower leg: Secondary | ICD-10-CM | POA: Diagnosis not present

## 2023-01-11 DIAGNOSIS — I10 Essential (primary) hypertension: Secondary | ICD-10-CM | POA: Diagnosis not present

## 2023-03-13 DIAGNOSIS — C9110 Chronic lymphocytic leukemia of B-cell type not having achieved remission: Secondary | ICD-10-CM | POA: Diagnosis not present

## 2023-03-14 DIAGNOSIS — C9110 Chronic lymphocytic leukemia of B-cell type not having achieved remission: Secondary | ICD-10-CM | POA: Diagnosis not present

## 2023-05-08 DIAGNOSIS — H35033 Hypertensive retinopathy, bilateral: Secondary | ICD-10-CM | POA: Diagnosis not present

## 2023-05-08 DIAGNOSIS — E119 Type 2 diabetes mellitus without complications: Secondary | ICD-10-CM | POA: Diagnosis not present

## 2023-05-08 DIAGNOSIS — H04123 Dry eye syndrome of bilateral lacrimal glands: Secondary | ICD-10-CM | POA: Diagnosis not present

## 2023-05-08 DIAGNOSIS — H2513 Age-related nuclear cataract, bilateral: Secondary | ICD-10-CM | POA: Diagnosis not present

## 2023-06-13 DIAGNOSIS — C9110 Chronic lymphocytic leukemia of B-cell type not having achieved remission: Secondary | ICD-10-CM | POA: Diagnosis not present

## 2023-06-13 DIAGNOSIS — R61 Generalized hyperhidrosis: Secondary | ICD-10-CM | POA: Diagnosis not present

## 2023-08-21 ENCOUNTER — Other Ambulatory Visit: Payer: Self-pay

## 2023-08-21 DIAGNOSIS — E6609 Other obesity due to excess calories: Secondary | ICD-10-CM

## 2023-08-21 NOTE — Telephone Encounter (Signed)
 Patient needs for the prescription of his Semaglutide , 2 MG/DOSE, 8 MG/3ML SOPN to be rewritten to the pharmacy below due to the patient's current pharmacy no longer hosting the prescription. Patient's wife, would like a call when the prescription is sent over.        [id number]    [address]    [place], [place]    [place]IP [address]    Phone: [phone]    Fax: [phone]            Patient's wife callback Number: [phone]

## 2023-08-22 NOTE — Telephone Encounter (Signed)
 Requesting rx rf of  Semaglutide  2mg   Last written 09/27/2022 Last OV 12/28/2022 Last A1c 11/08/2021= 6.2 Upcoming appt 01/08/24

## 2023-08-22 NOTE — Addendum Note (Signed)
 Addended by: Dickie Found on: 08/22/2023 03:48 PM   Modules accepted: Orders

## 2023-08-23 ENCOUNTER — Telehealth: Payer: Self-pay

## 2023-08-23 ENCOUNTER — Other Ambulatory Visit: Payer: Self-pay | Admitting: Sports Medicine

## 2023-08-23 DIAGNOSIS — E6609 Other obesity due to excess calories: Secondary | ICD-10-CM

## 2023-08-23 DIAGNOSIS — E66811 Obesity, class 1: Secondary | ICD-10-CM

## 2023-08-23 MED ORDER — TIRZEPATIDE 10 MG/0.5ML ~~LOC~~ SOAJ: SUBCUTANEOUS | 11 refills | Status: AC

## 2023-08-23 MED ORDER — SEMAGLUTIDE (2 MG/DOSE) 8 MG/3ML ~~LOC~~ SOPN: PEN_INJECTOR | SUBCUTANEOUS | 11 refills | Status: DC

## 2023-08-23 NOTE — Addendum Note (Signed)
 Addended by: Gean Keels on: 08/23/2023 09:49 AM   Modules accepted: Orders

## 2024-01-03 ENCOUNTER — Encounter: Payer: Self-pay | Admitting: Sports Medicine

## 2024-01-04 DIAGNOSIS — M1811 Unilateral primary osteoarthritis of first carpometacarpal joint, right hand: Secondary | ICD-10-CM | POA: Diagnosis not present

## 2024-01-04 DIAGNOSIS — M654 Radial styloid tenosynovitis [de Quervain]: Secondary | ICD-10-CM | POA: Diagnosis not present

## 2024-01-08 ENCOUNTER — Encounter: Payer: BC Managed Care – PPO | Admitting: Sports Medicine

## 2024-01-10 ENCOUNTER — Encounter: Admitting: Urgent Care

## 2024-01-29 ENCOUNTER — Ambulatory Visit (INDEPENDENT_AMBULATORY_CARE_PROVIDER_SITE_OTHER): Admitting: Urgent Care

## 2024-01-29 VITALS — BP 126/79 | HR 65 | Ht 71.0 in | Wt 236.0 lb

## 2024-01-29 DIAGNOSIS — I10 Essential (primary) hypertension: Secondary | ICD-10-CM | POA: Diagnosis not present

## 2024-01-29 DIAGNOSIS — F321 Major depressive disorder, single episode, moderate: Secondary | ICD-10-CM | POA: Diagnosis not present

## 2024-01-29 DIAGNOSIS — Z6832 Body mass index (BMI) 32.0-32.9, adult: Secondary | ICD-10-CM

## 2024-01-29 DIAGNOSIS — F5101 Primary insomnia: Secondary | ICD-10-CM

## 2024-01-29 DIAGNOSIS — Z23 Encounter for immunization: Secondary | ICD-10-CM

## 2024-01-29 DIAGNOSIS — Z0000 Encounter for general adult medical examination without abnormal findings: Secondary | ICD-10-CM

## 2024-01-29 DIAGNOSIS — M170 Bilateral primary osteoarthritis of knee: Secondary | ICD-10-CM

## 2024-01-29 DIAGNOSIS — E66811 Obesity, class 1: Secondary | ICD-10-CM

## 2024-01-29 DIAGNOSIS — E6609 Other obesity due to excess calories: Secondary | ICD-10-CM

## 2024-01-29 MED ORDER — DOXEPIN HCL 3 MG PO TABS: 3.0000 mg | ORAL_TABLET | Freq: Every evening | ORAL | 2 refills | Status: AC

## 2024-01-29 NOTE — Patient Instructions (Signed)
 Please upload all of your labs from the [facility] to Mychart.  Start taking 3mg  Silenor nightly. If after 1-2 weeks no improvement is noted, then increase to 6mg  nightly.  Please notify me of the dose of liposlim that you are taking nightly. Try to increase physical activity. To help with knee pain, use topical voltaren gel. You can use this up to four times daily.  Follow up annually, sooner as needed.

## 2024-01-29 NOTE — Progress Notes (Unsigned)
 Complete physical exam  Patient: Jeffrey Burnett   DOB: 1960/09/25   63 y.o. Male  MRN: [id number]  Subjective:    Chief Complaint  Patient presents with   Annual Exam    fasting    Jeffrey Burnett is a 63 y.o. male who presents today for a complete physical exam. He reports consuming a general diet. Walking several times daily, limited due to knee pain. He generally feels fairly well. He reports sleeping {DESC; WELL/FAIRLY WELL/POORLY:18703}. He {does/does not:200015} have additional problems to discuss today.    Most recent fall risk assessment:    12/28/2022    8:24 AM  Fall Risk   Falls in the past year? 0  Number falls in past yr: 0  Injury with Fall? 0  Risk for fall due to : No Fall Risks  Follow up Falls evaluation completed     Most recent depression screenings:    12/28/2022    8:37 AM 07/21/2019    2:51 PM  PHQ 2/9 Scores  PHQ - 2 Score 0 2  PHQ- 9 Score 0 12    {VISON DENTAL STD PSA (Optional):27386}  {History (Optional):23778}  Patient Care Team: Curtis Debby PARAS, MD as PCP - General (Family Medicine)   Outpatient Medications Prior to Visit  Medication Sig   amLODipine  (NORVASC ) 5 MG tablet Take 1 tablet (5 mg total) by mouth daily.   celecoxib  (CELEBREX ) 200 MG capsule One to 2 tablets by mouth daily as needed for pain.   clindamycin (CLEOCIN T) 1 % external solution    clobetasol (TEMOVATE) 0.05 % external solution APPLY EXTERNALLY TO THE AFFECTED AREA TWICE DAILY   ergocalciferol  (VITAMIN D2) 1.25 MG (50000 UT) capsule Take by mouth.   irbesartan  (AVAPRO ) 300 MG tablet Take 1 tablet (300 mg total) by mouth daily.   mirabegron  ER (MYRBETRIQ ) 50 MG TB24 tablet Take 1 tablet (50 mg total) by mouth daily.   Omega-3 Fatty Acids (FISH OIL) 1000 MG CAPS Take by mouth.   terbinafine (LAMISIL) 250 MG tablet Take 250 mg by mouth daily.   tirzepatide  (MOUNJARO ) 10 MG/0.5ML Pen Liposlim.  Tirzepatide /Pyridoxine/Thiamine/L-Carnitine 10mg /mL.  Inject 2.5  mg/25 units subcu weekly for 4 weeks then 5 mg/50 units subcu weekly for 4 weeks then 7.5 mg/75 units subcu weekly for 4 weeks then 10 mg/100 units subcu weekly for 4 weeks then 15 mg/150 units subcu weekly   triamcinolone  cream (KENALOG ) 0.1 %    venlafaxine XR (EFFEXOR-XR) 75 MG 24 hr capsule    No facility-administered medications prior to visit.    ROS        Objective:     BP 126/79   Pulse 65   Ht 5' 11 (1.803 m)   Wt 236 lb (107 kg)   SpO2 97%   BMI 32.92 kg/m  BP Readings from Last 3 Encounters:  01/29/24 126/79  12/28/22 111/70  06/29/22 120/76   Wt Readings from Last 3 Encounters:  01/29/24 236 lb (107 kg)  12/28/22 211 lb 1.9 oz (95.8 kg)  06/29/22 223 lb (101.2 kg)      Physical Exam   No results found for any visits on 01/29/24. {Show previous labs (optional):23779}    Assessment & Plan:    Routine Health Maintenance and Physical Exam  Immunization History  Administered Date(s) Administered    sv, Bivalent, Protein Subunit Rsvpref,pf [place]) 07/13/2022   Influenza Split 02/21/2013, 03/10/2016, 03/01/2018   Influenza, Seasonal, Injecte, Preservative Fre 12/28/2022   Influenza,inj,Quad PF,6+ Mos  03/07/2017, 03/18/2018, 02/06/2019, 02/12/2020, 03/09/2021, 01/06/2022   Influenza-Unspecified 02/21/2013, 01/29/2017   Moderna Covid-19 Fall Seasonal Vaccine 72yrs & older 02/17/2022, 07/10/2023   Moderna Covid-19 Vaccine Bivalent Booster 1yrs & up 03/23/2021   PFIZER(Purple Top)SARS-COV-2 Vaccination 06/27/2019, 07/18/2019, 01/12/2020   PNEUMOCOCCAL CONJUGATE-20 07/13/2022   Pneumococcal Polysaccharide-23 09/21/2016   Tdap 01/29/2007, 09/21/2016, 06/07/2017   Zoster Recombinant(Shingrix) 09/17/2019, 12/10/2019    Health Maintenance  Topic Date Due   Influenza Vaccine  11/30/2023   COVID-19 Vaccine (7 - Pfizer risk 2024-25 season) 01/10/2024   DTaP/Tdap/Td (4 - Td or Tdap) 06/08/2027   Colonoscopy  09/16/2029   Pneumococcal Vaccine: 50+ Years   Completed   Hepatitis C Screening  Completed   HIV Screening  Completed   Zoster Vaccines- Shingrix  Completed   Hepatitis B Vaccines 19-59 Average Risk  Aged Out   HPV VACCINES  Aged Out   Meningococcal B Vaccine  Aged Out    Discussed health benefits of physical activity, and encouraged him to engage in regular exercise appropriate for his age and condition.  Problem List Items Addressed This Visit   None Visit Diagnoses       Immunization due    -  Primary   Relevant Orders   Flu vaccine trivalent PF, 6mos and older(Flulaval,Afluria,Fluarix,Fluzone)      No follow-ups on file.     Benton LITTIE Gave, PA

## 2024-01-29 NOTE — Progress Notes (Unsigned)
 Jeffrey Burnett

## 2024-01-30 ENCOUNTER — Telehealth: Payer: Self-pay

## 2024-01-30 ENCOUNTER — Other Ambulatory Visit (HOSPITAL_COMMUNITY): Payer: Self-pay

## 2024-01-30 NOTE — Telephone Encounter (Signed)
 Pharmacy Patient Advocate Encounter   Received notification from CoverMyMeds that prior authorization for Doxepin 3mg  caps is required/requested.   Insurance verification completed.   The patient is insured through [employer].   Per test claim: PA required; PA submitted to above mentioned insurance via Latent Key/confirmation #/EOC [id number] Status is pending

## 2024-01-31 ENCOUNTER — Encounter: Payer: Self-pay | Admitting: Urgent Care

## 2024-01-31 DIAGNOSIS — Z23 Encounter for immunization: Secondary | ICD-10-CM | POA: Diagnosis not present

## 2024-02-05 ENCOUNTER — Other Ambulatory Visit (HOSPITAL_COMMUNITY): Payer: Self-pay

## 2024-02-05 NOTE — Telephone Encounter (Signed)
 Place a call to [facility] to check the status of the PA  Per the rep, we should receive a determination by 02/14/24.   Case# [id number]  Phone#  [phone]

## 2024-02-06 ENCOUNTER — Other Ambulatory Visit (HOSPITAL_COMMUNITY): Payer: Self-pay

## 2024-02-06 NOTE — Telephone Encounter (Signed)
 So doxepin doesn't work the same way as the ones listed. Its a completely different med and thus not comparable. Any way to see if the 10mg  is covered (this isn't indicated for sleep but he could cut in half to = the sleep dose...)

## 2024-02-06 NOTE — Telephone Encounter (Signed)
 Pharmacy Patient Advocate Encounter  Received notification from [facility] that Prior Authorization for Doxepin has been CANCELLED due to the Doxepin is part of step therapy and requires a trial of a step-one medication before a more expensive step-two or step-three medication is covered.

## 2024-02-07 ENCOUNTER — Other Ambulatory Visit (HOSPITAL_COMMUNITY): Payer: Self-pay

## 2024-02-07 MED ORDER — ZOLPIDEM TARTRATE 5 MG PO TABS: 5.0000 mg | ORAL_TABLET | Freq: Every evening | ORAL | 1 refills | Status: AC | PRN

## 2024-02-07 NOTE — Addendum Note (Signed)
 Addended by: Spero Gunnels on: 02/07/2024 12:50 PM   Modules accepted: Orders

## 2024-02-11 ENCOUNTER — Other Ambulatory Visit (HOSPITAL_COMMUNITY): Payer: Self-pay

## 2024-03-19 ENCOUNTER — Telehealth: Payer: Self-pay

## 2024-03-19 NOTE — Telephone Encounter (Signed)
 Copied from CRM #[id number]. Topic: Clinical - Prescription Issue >> Mar 19, 2024 11:54 AM Avram MATSU wrote: Reason for CRM: patient is calling asking for a letter stating that lipo slim injections medication is medically necessary so pt can be reimburse, please advise [phone]

## 2024-03-24 ENCOUNTER — Other Ambulatory Visit: Payer: Self-pay | Admitting: Urgent Care

## 2024-03-24 NOTE — Telephone Encounter (Signed)
 Actually, pt does not have mychart. Letter will need to be printed and mailed to patient please. Thanks!

## 2024-03-24 NOTE — Telephone Encounter (Signed)
 Spoke with patient, he is going to have his wife come by the office to pick this letter up.

## 2024-03-24 NOTE — Telephone Encounter (Signed)
 I have placed a note in the communications tab for patient to access on Mychart. thanks

## 2024-03-24 NOTE — Progress Notes (Signed)
 Letter of medical necessity written for liposlim injections per pt request.

## 2025-01-29 ENCOUNTER — Encounter: Admitting: Urgent Care
